# Patient Record
Sex: Female | Born: 1950 | Race: White | Hispanic: No | Smoking: Never smoker
Health system: Southern US, Community
[De-identification: ages and names within clinical notes are randomized; demographics above are authoritative.]

## PROBLEM LIST (undated history)

## (undated) DIAGNOSIS — M1990 Unspecified osteoarthritis, unspecified site: Secondary | ICD-10-CM

## (undated) DIAGNOSIS — M069 Rheumatoid arthritis, unspecified: Secondary | ICD-10-CM

## (undated) DIAGNOSIS — D649 Anemia, unspecified: Secondary | ICD-10-CM

## (undated) DIAGNOSIS — E119 Type 2 diabetes mellitus without complications: Secondary | ICD-10-CM

## (undated) DIAGNOSIS — I10 Essential (primary) hypertension: Secondary | ICD-10-CM

## (undated) HISTORY — PX: MOUTH SURGERY: SHX715

## (undated) HISTORY — DX: Anemia, unspecified: D64.9

---

## 2014-02-27 ENCOUNTER — Encounter (HOSPITAL_COMMUNITY): Payer: Self-pay | Admitting: Emergency Medicine

## 2014-02-27 ENCOUNTER — Emergency Department (HOSPITAL_COMMUNITY)
Admission: EM | Admit: 2014-02-27 | Discharge: 2014-02-27 | Disposition: A | Discharge status: Home / Self Care | Payer: BC Managed Care – PPO | Attending: Emergency Medicine | Admitting: Emergency Medicine

## 2014-02-27 DIAGNOSIS — Z791 Long term (current) use of non-steroidal anti-inflammatories (NSAID): Secondary | ICD-10-CM | POA: Insufficient documentation

## 2014-02-27 DIAGNOSIS — I83899 Varicose veins of unspecified lower extremities with other complications: Secondary | ICD-10-CM

## 2014-02-27 DIAGNOSIS — E119 Type 2 diabetes mellitus without complications: Secondary | ICD-10-CM | POA: Insufficient documentation

## 2014-02-27 DIAGNOSIS — E669 Obesity, unspecified: Secondary | ICD-10-CM | POA: Insufficient documentation

## 2014-02-27 DIAGNOSIS — I8390 Asymptomatic varicose veins of unspecified lower extremity: Secondary | ICD-10-CM | POA: Insufficient documentation

## 2014-02-27 DIAGNOSIS — I10 Essential (primary) hypertension: Secondary | ICD-10-CM | POA: Insufficient documentation

## 2014-02-27 DIAGNOSIS — Z79899 Other long term (current) drug therapy: Secondary | ICD-10-CM | POA: Insufficient documentation

## 2014-02-27 DIAGNOSIS — IMO0002 Reserved for concepts with insufficient information to code with codable children: Secondary | ICD-10-CM | POA: Insufficient documentation

## 2014-02-27 HISTORY — DX: Essential (primary) hypertension: I10

## 2014-02-27 HISTORY — DX: Type 2 diabetes mellitus without complications: E11.9

## 2014-02-27 NOTE — Discharge Instructions (Signed)
Monitor legs closely for the next few days for recurrent bleeding or further ruptured varicose veins.   If significant swelling, redness, warmth to touch, or pain in calf when walking occurs, return to the ED for repeat evaluation.  Bleeding Varicose Veins Varicose veins are veins that have become enlarged and twisted. Valves in the veins help return blood from the leg to the heart. If these valves are damaged, blood flows backwards and backs up into the veins in the leg near the skin. This causes the veins to become larger because of increased pressure within. Sometimes these veins bleed. CAUSES  Factors that can lead to bleeding varicose veins include:  Thinning of the skin that covers the veins. This skin is stretched as the veins enlarge.  Weak and thinning walls of the varicose veins. These thin walls are part of the reason why blood is not flowing normally to the heart.  Having high pressure in the veins. This high pressure occurs because the blood is not flowing freely back up to the heart.  Injury. Even a small injury to a varicose vein can cause bleeding.  Open wounds. A sore may develop near a varicose vein and not heal. This makes bleeding more likely.  Taking medicine that thins the blood. These medicines may include aspirin, anti-inflammatory medicine, and other blood thinners. SYMPTOMS  If bleeding is on the outside surface of the skin, blood can be seen. Sometimes, the bleeding stays under the skin. If this happens, the blue or purple area will spread beyond the vein. This discoloration may be visible. DIAGNOSIS  To decide if you have a bleeding varicose vein, your caregiver may:  Ask about your symptoms. This will include when you first saw bleeding.  Ask about how long you have had varicose veins and if they cause you problems.  Ask about your overall health.  Ask about possible causes, like recent cuts or if the area near the varicose veins was bumped or  injured.  Examine the skin or leg that concerns you. Your caregiver will probably feel the veins.  Order imaging tests. These create detailed pictures of the veins. TREATMENT  The first goal of treating bleeding varicose veins is to stop the bleeding. Then, the aim is to keep any bleeding from happening again. Treatment will depend on the cause of the bleeding and how bad it is. Ask your caregiver about what would be best for you. Options include:  Raising (elevating) your leg. Lie down with your leg propped up on a pillow or cushion. Your foot should be above your heart.  Applying pressure to the spot that is bleeding. The bleeding should stop in a short time.  Wearing elastic stocking that "compress" your legs (compression stockings). An elastic bandage may do the same thing.  Applying an antibiotic cream on sores that are not healing.  Surgically removing or closing off the bleeding varicose veins. HOME CARE INSTRUCTIONS   Apply any creams that your caregiver prescribed. Follow the directions carefully.  Wear compression stockings or any special wraps that were prescribed. Make sure you know:  If you should wear them every day.  How long you should wear them.  If veins were removed or closed, a bandage (dressing) will probably cover the area. Make sure you know:  How often the dressing should be changed.  Whether the area can get wet.  When you can leave the skin uncovered.  Check your skin every day. Look for new sores and signs of  bleeding.  To prevent future bleeding:  Use extra care in situations where you could cut your legs. Shaving, for example, or working outside in the garden.  Try to keep your legs elevated as much as possible. Lie down when you can. SEEK MEDICAL CARE IF:   You have any questions about how to wear compression stockings or elastic bandages.  Your veins continue to bleed.  Sores develop near your varicose veins.  You have a sore that does  not heal or gets bigger.  Pain increases in your leg.  The area around a varicose vein becomes warm, red, or tender to the touch.  You notice a yellowish fluid that smells bad coming from a spot where there was bleeding.  You develop a fever of more than 100.5 F (38.1 C). SEEK IMMEDIATE MEDICAL CARE IF:   You develop a fever of more than 102 F (38.9 C). Document Released: 02/18/2009 Document Revised: 12/25/2011 Document Reviewed: 11/28/2010 [facility] Patient Information 2014 [place].

## 2014-02-27 NOTE — ED Provider Notes (Signed)
CSN: [id number]     Arrival date & time 02/27/14  [id number] History   First MD Initiated Contact with Patient 02/27/14 [phone]     Chief Complaint  Patient presents with  . Leg Swelling     (Consider location/radiation/quality/duration/timing/severity/associated sxs/prior Treatment) The history is provided by the patient and medical records.   This is a 63 year old female with past medical history significant for hypertension and diabetes, presenting to the ED for spontaneous bleeding of her left lower extremity.  Pt states she was walking out of her daughters house and felt that her pants were yet so she looked down and saw blood spewing out of her left medial calf.  No injury, trauma, or laceration.  States leg continued bleeding for additional 10-15 despite constant pressure so called EMS.  By time of EMS arrival bleeding has stopped without recurrence.  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  She does have some varicose veins.  No hx of venous stasis or claudication.  Denies pain of either leg, no pain when walking.  Pt is not on any anti-coagulants.  No prior hx of DVT or PE.  No chest pain or SOB.  Past Medical History  Diagnosis Date  . Hypertension   . Diabetes mellitus without complication    History reviewed. No pertinent past surgical history. History reviewed. No pertinent family history. History  Substance Use Topics  . Smoking status: Never Smoker   . Smokeless tobacco: Not on file  . Alcohol Use: No   OB History   Grav Para Term Preterm Abortions TAB SAB Ect Mult Living                 Review of Systems  Hematological:       Bleeding  All other systems reviewed and are negative.     Allergies  Quinolones  Home Medications   Prior to Admission medications   Medication Sig Start Date End Date Taking? Authorizing Provider  cetirizine (ZYRTEC) 10 MG tablet  Take 10 mg by mouth daily.   Yes Historical Provider, MD  cyclobenzaprine (FLEXERIL) 10 MG tablet Take 10 mg by mouth 3 (three) times daily as needed for muscle spasms.   Yes Historical Provider, MD  diclofenac (VOLTAREN) 75 MG EC tablet Take 75 mg by mouth 2 (two) times daily.   Yes Historical Provider, MD  fluticasone (FLONASE) 50 MCG/ACT nasal spray Place into both nostrils daily.   Yes Historical Provider, MD  metFORMIN (GLUCOPHAGE) 500 MG tablet Take 250 mg by mouth 2 (two) times daily with a meal.   Yes Historical Provider, MD  Multiple Vitamins-Minerals (MULTIVITAMIN WITH MINERALS) tablet Take 1 tablet by mouth daily.   Yes Historical Provider, MD  OVER THE COUNTER MEDICATION Apply 1 drop to eye at bedtime as needed (for dry eyes).   Yes Historical Provider, MD  ramipril (ALTACE) 2.5 MG capsule Take 2.5 mg by mouth daily.   Yes Historical Provider, MD  ranitidine (ZANTAC) 150 MG tablet Take 150 mg by mouth 2 (two) times daily as needed for heartburn.   Yes Historical Provider, MD  sitaGLIPtin (JANUVIA) 100 MG tablet Take 100 mg by mouth daily.   Yes Historical Provider, MD  vitamin E 400 UNIT capsule Take 400 Units by mouth daily.   Yes Historical Provider, MD   BP 181/94  Pulse 77  Temp(Src) 98.2 F (36.8  C) (Oral)  Resp 18  Ht 5\' 8"  (1.727 m)  Wt 240 lb (108.863 kg)  BMI 36.50 kg/m2  SpO2 100%  Physical Exam  Nursing note and vitals reviewed. Constitutional: She is oriented to person, place, and time. She appears well-developed and well-nourished.  obese  HENT:  Head: Normocephalic and atraumatic.  Mouth/Throat: Oropharynx is clear and moist.  Eyes: Conjunctivae and EOM are normal. Pupils are equal, round, and reactive to light.  Neck: Normal range of motion.  Cardiovascular: Normal rate, regular rhythm and normal heart sounds.   Pulmonary/Chest: Effort normal and breath sounds normal.  Musculoskeletal: Normal range of motion.  BLE with multiple small variscosities; LLE  with fresh scab overlying one of variscosities; no active bleeding or signs of superimposed infection; no focal tenderness No bony deformities; No significant calf asymmetry, tenderness, or palpable cords; mild swelling of left ankle when compared with right; no overlying erythema or warmth to touch Strong DP pulses bilaterally  Neurological: She is alert and oriented to person, place, and time.  Skin: Skin is warm and dry.  Psychiatric: She has a normal mood and affect.    ED Course  Procedures (including critical care time) Labs Review Labs Reviewed - No data to display  Imaging Review No results found.   EKG Interpretation None      MDM   Final diagnoses:  Ruptured varicose vein   63 year old female presenting with spontaneous bleeding of left lower extremity today without known injury. On arrival to the ED bleeding has stopped. She has a small scab overlying a left lower extremity varicosity. Both her bilateral extremities have multiple small varicosities. There is no significant calf asymmetry, tenderness, or palpable cords. There is mild swelling of her left ankle compared with right.  No overlying erythema or warmth to touch.  DP pulses intact bilaterally.  Pt not on anti-coagulants.  At this time i have low suspicion for DVT.  Spontaneous bleeding likely from ruptured varicose vein.  She will FU with her PCP.  Discussed plan with patient, he/she acknowledged understanding and agreed with plan of care.  Return precautions given for new or worsening symptoms.  Garlon HatchetLisa M Monisha Siebel, PA-C 02/27/14 1012

## 2014-02-27 NOTE — ED Provider Notes (Signed)
Medical screening examination/treatment/procedure(s) were performed by non-physician practitioner and as supervising physician I was immediately available for consultation/collaboration.     Jennifer Lyonsouglas Daivion Pape, MD 02/27/14 [phone]

## 2014-02-27 NOTE — ED Notes (Signed)
Pt here from home with c/o  Bleeding from the lfet lower leg, pt does have some swelling to both legs , pt states that they held pressure for 10 to 15 mins , no bleeding at this time

## 2014-03-02 ENCOUNTER — Other Ambulatory Visit: Payer: Self-pay | Admitting: *Deleted

## 2014-03-02 DIAGNOSIS — I83893 Varicose veins of bilateral lower extremities with other complications: Secondary | ICD-10-CM

## 2014-03-24 ENCOUNTER — Encounter: Payer: Self-pay | Admitting: Vascular Surgery

## 2014-03-25 ENCOUNTER — Ambulatory Visit (HOSPITAL_COMMUNITY)
Admission: RE | Admit: 2014-03-25 | Discharge: 2014-03-25 | Disposition: A | Discharge status: Home / Self Care | Payer: BC Managed Care – PPO | Source: Ambulatory Visit | Attending: Vascular Surgery | Admitting: Vascular Surgery

## 2014-03-25 ENCOUNTER — Ambulatory Visit (INDEPENDENT_AMBULATORY_CARE_PROVIDER_SITE_OTHER): Payer: BC Managed Care – PPO | Admitting: Vascular Surgery

## 2014-03-25 ENCOUNTER — Encounter: Payer: Self-pay | Admitting: Vascular Surgery

## 2014-03-25 VITALS — BP 154/75 | HR 80 | Ht 68.0 in | Wt 247.0 lb

## 2014-03-25 DIAGNOSIS — I83893 Varicose veins of bilateral lower extremities with other complications: Secondary | ICD-10-CM

## 2014-03-25 NOTE — Progress Notes (Signed)
[facility] [place] [facility]   CC:  Bleeding from varicose vein  No ref. provider found  HPI: This is a 63 y.o. female who states that on Feb 27, 2014, she was getting ready for work that morning and states that she started having bleeding from her lower left leg.  She states that she and her daughter held pressure, but the bleeding would not stop.  EMS was called and by the time they arrived, the bleeding had stopped.  [facility] and was discharged home.  The next day, the same thing happened and she was bleeding from the same place.  EMS was called and she was taken to the ER.  There a suture was placed and pressure dressing applied.  She has not had any more bleeding.  She states that she has a vein above where she was bleeding that she is concerned about.  She states that she has had some swelling in her legs. 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  She has not tried compression stockings.    She states that she does have DM and takes Metformin.  She is also seeing a rheumatologist for possible RA.  She is on ACEI for HTN.  Past Medical History  Diagnosis Date  . Hypertension   . Diabetes mellitus without complication   . Anemia    History reviewed. No pertinent past surgical history.  Allergies  Allergen Reactions  . Quinolones Swelling    Current Outpatient Prescriptions  Medication Sig Dispense Refill  . diclofenac (VOLTAREN) 75 MG EC tablet Take 75 mg by mouth 2 (two) times daily.      . furosemide (LASIX) 20 MG tablet Take by mouth daily.       . metFORMIN (GLUCOPHAGE) 500 MG tablet Take 250 mg by mouth 2 (two) times daily with a meal.      . ramipril (ALTACE) 2.5 MG capsule Take 2.5 mg by mouth daily.      . sitaGLIPtin (JANUVIA) 100 MG tablet Take 100 mg by mouth daily.      . vitamin E 400 UNIT capsule Take 400 Units by mouth daily.      . cetirizine (ZYRTEC) 10 MG tablet Take 10 mg by mouth daily.      .  cyclobenzaprine (FLEXERIL) 10 MG tablet Take 10 mg by mouth 3 (three) times daily as needed for muscle spasms.      . fluticasone (FLONASE) 50 MCG/ACT nasal spray Place into both nostrils daily.      . Multiple Vitamins-Minerals (MULTIVITAMIN WITH MINERALS) tablet Take 1 tablet by mouth daily.      Marland Kitchen. OVER THE COUNTER MEDICATION Apply 1 drop to eye at bedtime as needed (for dry eyes).      . ranitidine (ZANTAC) 150 MG tablet Take 150 mg by mouth 2 (two) times daily as needed for heartburn.       No current facility-administered medications for this visit.    Family History  Problem Relation Age of Onset  . Diabetes Mother   . Hyperlipidemia Mother   . Hypertension Mother   . Hypertension Father     History   Social History  . Marital Status: Divorced    Spouse Name: N/A    Number of Children: N/A  . Years of Education: N/A   Occupational History  . Not on file.   Social History Main Topics  . Smoking status:  Never Smoker   . Smokeless tobacco: Never Used  . Alcohol Use: No  . Drug Use: No  . Sexual Activity: Not on file   Other Topics Concern  . Not on file   Social History Narrative  . No narrative on file     ROS: [x]  Positive   [ ]  Negative   [ ]  All sytems reviewed and are negative  Cardiovascular: []  chest pain/pressure []  palpitations []  SOB lying flat []  DOE []  pain in legs while walking []  pain in feet when lying flat []  hx of DVT []  hx of phlebitis [x]  swelling in legs [x]  varicose veins  Pulmonary: []  productive cough []  asthma []  wheezing  Neurologic: []  weakness in []  arms []  legs []  numbness in []  arms []  legs [] difficulty speaking or slurred speech []  temporary loss of vision in one eye []  dizziness  Hematologic: []  bleeding problems []  problems with blood clotting easily  Endocrine: [x]  DM  GI []  vomiting blood []  blood in stool  GU: []  burning with urination []  blood in urine  Psychiatric: []  hx of major  depression  Integumentary: [x]  rashes-eczema []  ulcers  Constitutional: []  fever []  chills   PHYSICAL EXAMINATION:  Filed Vitals:   03/25/14 1520  BP: 154/75  Pulse: 80   Body mass index is 37.56 kg/(m^2).  General:  WDWN in NAD Gait: Not observed HENT: WNL, normocephalic Eyes: Pupils equal Pulmonary: normal non-labored breathing , without Rales, rhonchi,  wheezing Cardiac: RRR, without  Murmurs, rubs or gallops; without carotid bruits Abdomen: soft, NT, no masses Skin: without rashes, without ulcers  Vascular Exam/Pulses:  Right Left  Radial 2+ (normal) 2+ (normal)  Ulnar 1+ (weak) 1+ (weak)  DP 2+ (normal) 2+ (normal)  PT Unable to palpate Unable to palpate   Extremities: without ischemic changes, without Gangrene , without cellulitis; without open wounds; + edema BLE; healed area of previous bleed; + varicosity LLE; teleangectasias BLE Musculoskeletal: no muscle wasting or atrophy  Neurologic: A&O X 3; Appropriate Affect ; SENSATION: normal; MOTOR FUNCTION:  moving all extremities equally. Speech is fluent/normal   Non-Invasive Vascular Imaging:   Lower extremity venous reflux evaluation 03/25/14: 1.  Positive for deep vein reflux in CFV & SFV 2.  No reflux demonstrated in GSV 3.  No overt perforation of VV near area of rupture.  Pt meds includes: Statin:  no Beta Blocker:  no Aspirin:  no ACEI:  yes ARB:  no Other Antiplatelet/Anticoagulant:  no  ASSESSMENT/PLAN:: 63 y.o. female with recent bleeding from varicose vein in left lower leg.   -pt does have some deep vein reflux, but no superficial venous reflux. -pt is given instructions on what to do if she does have a bleed again in the future. -she is instructed on proper leg elevation to help improve the swelling in her legs. -she will f/u with [place] on a PRN basis  Doreatha Massed, PA-C [facility] [phone]  Clinic MD:  Pt seen and examined in conjunction with Dr. Edilia Bo  Agree  with above. We have discussed the importance of intermittent leg elevation and the use of compression stockings. We will see her back as needed to  Waverly Ferrari, MD, FACS Beeper [phone] 03/25/2014

## 2016-08-07 ENCOUNTER — Other Ambulatory Visit: Payer: Self-pay | Admitting: Radiology

## 2016-08-07 ENCOUNTER — Telehealth: Payer: Self-pay | Admitting: Rheumatology

## 2016-08-07 MED ORDER — METHOTREXATE 2.5 MG PO TABS: ORAL_TABLET | ORAL | 0 refills | Status: DC

## 2016-08-07 NOTE — Telephone Encounter (Signed)
Last visit 04/04/16 Next visit 08/22/16 Labs WNL 07/19/16 Ok to refill MTX per SD

## 2016-08-07 NOTE — Telephone Encounter (Signed)
Pt is requesting refill of MTX.  [facility] [place]

## 2016-08-09 NOTE — Telephone Encounter (Signed)
This was done on 08/07/16

## 2016-08-19 DIAGNOSIS — M0579 Rheumatoid arthritis with rheumatoid factor of multiple sites without organ or systems involvement: Secondary | ICD-10-CM | POA: Insufficient documentation

## 2016-08-19 DIAGNOSIS — Z79899 Other long term (current) drug therapy: Secondary | ICD-10-CM | POA: Insufficient documentation

## 2016-08-21 DIAGNOSIS — E119 Type 2 diabetes mellitus without complications: Secondary | ICD-10-CM | POA: Insufficient documentation

## 2016-08-21 DIAGNOSIS — I10 Essential (primary) hypertension: Secondary | ICD-10-CM | POA: Insufficient documentation

## 2016-08-21 NOTE — Progress Notes (Signed)
*IMAGE* Office Visit Note  Patient: Jennifer Horn             Date of Birth: 09/25/1951           MRN: [id number]             PCP: Berton Bon M.D. Referring: No ref. provider found Visit Date: 08/22/2016 Occupation: High school teacher    Subjective:  Pain knees   History of Present Illness: Lael Wetherbee is a 65 y.o. female with history of sero positive rheumatoid arthritis. She states that she's been doing fairly well on methotrexate 4 tablets per week. She has some stiffness in her hands and knee joints. The pain in the knee joints comes and goes. The neck pain is controlled currently. She denies any joint swelling.   Activities of Daily Living:  Patient reports morning stiffness for 10 minutes.   Patient Denies nocturnal pain.  Difficulty dressing/grooming: Denies Difficulty climbing stairs: Reports Difficulty getting out of chair: Reports Difficulty using hands for taps, buttons, cutlery, and/or writing: Denies   Review of Systems  Constitutional: Negative for fatigue, night sweats, weight gain, weight loss and weakness.  HENT: Negative for mouth sores, trouble swallowing, trouble swallowing, mouth dryness and nose dryness.   Eyes: Negative for pain, redness, visual disturbance and dryness.  Respiratory: Negative for cough, shortness of breath and difficulty breathing.   Cardiovascular: Positive for hypertension. Negative for chest pain, palpitations, irregular heartbeat and swelling in legs/feet.  Gastrointestinal: Negative for blood in stool, constipation and diarrhea.  Endocrine: Negative for increased urination.  Genitourinary: Negative for vaginal dryness.  Musculoskeletal: Positive for arthralgias, joint pain and morning stiffness. Negative for joint swelling, myalgias, muscle weakness, muscle tenderness and myalgias.  Skin: Negative for color change, rash, hair loss, skin tightness, ulcers and sensitivity to sunlight.  Allergic/Immunologic: Negative for  susceptible to infections.  Neurological: Negative for dizziness, memory loss and night sweats.  Hematological: Negative for swollen glands.  Psychiatric/Behavioral: Negative for depressed mood and sleep disturbance. The patient is not nervous/anxious.     PMFS History:  Patient Active Problem List   Diagnosis Date Noted  . Diabetes (HCC) 08/21/2016  . Hypertension 08/21/2016  . Rheumatoid arthritis with rheumatoid factor of multiple sites without organ or systems involvement (HCC) 08/19/2016  . High risk medication use 08/19/2016  . Varicose veins of lower extremities with other complications 03/25/2014    Past Medical History:  Diagnosis Date  . Anemia   . Diabetes mellitus without complication (HCC)   . Hypertension     Family History  Problem Relation Age of Onset  . Diabetes Mother   . Hyperlipidemia Mother   . Hypertension Mother   . Hypertension Father    Past Surgical History:  Procedure Laterality Date  . MOUTH SURGERY     Social History   Social History Narrative  . No narrative on file     Objective: Vital Signs: BP (!) 182/92 (BP Location: Right Arm, Patient Position: Sitting, Cuff Size: Large)   Pulse 81   Resp 14   Ht 5\' 7"  (1.702 m)   Wt 257 lb (116.6 kg)   BMI 40.25 kg/m    Physical Exam  Constitutional: She is oriented to person, place, and time. She appears well-developed and well-nourished.  HENT:  Head: Normocephalic and atraumatic.  Eyes: Conjunctivae and EOM are normal.  Neck: Normal range of motion.  Cardiovascular: Normal rate, regular rhythm, normal heart sounds and intact distal pulses.   Pulmonary/Chest: Effort normal and  breath sounds normal.  Abdominal: Soft. Bowel sounds are normal.  Lymphadenopathy:    She has no cervical adenopathy.  Neurological: She is alert and oriented to person, place, and time.  Skin: Skin is warm and dry. Capillary refill takes less than 2 seconds.  Psychiatric: She has a normal mood and affect. Her  behavior is normal.  Nursing note and vitals reviewed.    Musculoskeletal Exam: She has good range of motion of her C-spine, she has mild kyphosis and thoracic spine, some limitation of range of motion of her lumbar spine. She has good range of motion of her shoulders elbows wrists joints MCP joints and PIP joints with mild thickening of her DIP joints. No synovitis was noted. Hip joints, knee joints, ankle joints were good range of motion she has thickening of PIP joints. No synovitis was noted.  CDAI Exam: No CDAI exam completed.    Investigation: Findings:  May 2015 TB gold negative, hepatitis negative, SPEP normal, immunoglobulins normal, G6PD normal, 07/18/2016 CBC showed hemoglobin 11.3,, CMP creatinine 1.22, GFR 47, 01/14/2016 creatinine 1.07 GFR 55    Imaging: Xr Foot 2 Views Left  Result Date: 08/22/2016 First MTP narrowing and hallux valgus. All PIP/DIP narrowing. No erosive changes were noted. Impression: These findings are consistent with osteoarthritis no erosive changes were noted.  Xr Foot 2 Views Right  Result Date: 08/22/2016 First MTP narrowing and hallux valgus. All PIP/DIP narrowing. No erosive changes were noted. Impression: These findings are consistent with osteoarthritis no erosive changes were noted.  Xr Hand 2 View Left  Result Date: 08/22/2016 Narrowing noted in all PIP and DIP joints, no MCP joint narrowing or erosive changes were noted. Mild radiocarpal joint space narrowing was noted. No erosive changes were noted in the carpal bones. These findings were consistent with inflammatory arthritis and osteoarthritis overlap  Xr Hand 2 View Right  Result Date: 08/22/2016 Narrowing noted in all PIP and DIP joints, no MCP joint narrowing or erosive changes were noted. Mild radiocarpal joint space narrowing was noted. No erosive changes were noted in the carpal bones. These findings were consistent with inflammatory arthritis and osteoarthritis  overlap.   Speciality Comments: No specialty comments available.    Procedures:  No procedures performed Allergies: Levofloxacin and Quinolones   Assessment / Plan: Visit Diagnoses:   Rheumatoid arthritis with rheumatoid factor of multiple sites without organ or systems involvement (HCC) - +RF,-CCP,highESR, +synovitis on US. She is clinically doing well without any synovitis on examination she has mild tenderness or PIP joints she has thickening of her DIP joints which is consistent with osteoarthritis. She has some stiffness in her hands and feet and I'll obtain x-rays of her bilateral hands and feet today.  High risk medication use: She is on methotrexate 4 tablets by mouth every week along with folic acid. Her labs have been stable. Her GFR is low at 55. I will check her labs again in January and every 3 months to monitor for drug toxicity standing orders were given today.  Primary osteoarthritis of knee: Her knee joint pain is better. Weight loss diet and exercise was discussed.    DJD (degenerative joint disease), cervical: She has minimal stiffness.   Type 2 diabetes mellitus: Followed up by her PCP  Hypertension,: Her blood pressure is high today. She states her blood pressure is usually high in physician's office. 5 advised her to monitor her pressure closely and follow up with her PCP.   Orders: Orders Placed This Encounter  Procedures  . XR Hand 2 View Right  . XR Hand 2 View Left  . XR Foot 2 Views Right  . XR Foot 2 Views Left  . CBC with Differential/Platelet  . COMPLETE METABOLIC PANEL WITH GFR     Face-to-face time spent with patient was 30 minutes. 50% of time was spent in counseling and coordination of care.  Follow-Up Instructions: Return in about 5 months (around 01/20/2017) for Rheumatoid arthritis.      Pollyann SavoyShaili Donyell Ding, MD, Aileen PilotFACR

## 2016-08-22 ENCOUNTER — Telehealth: Payer: Self-pay | Admitting: *Deleted

## 2016-08-22 ENCOUNTER — Encounter: Payer: Self-pay | Admitting: Rheumatology

## 2016-08-22 ENCOUNTER — Ambulatory Visit (INDEPENDENT_AMBULATORY_CARE_PROVIDER_SITE_OTHER): Payer: BC Managed Care – PPO | Admitting: Rheumatology

## 2016-08-22 ENCOUNTER — Ambulatory Visit (INDEPENDENT_AMBULATORY_CARE_PROVIDER_SITE_OTHER): Payer: Self-pay

## 2016-08-22 VITALS — BP 182/92 | HR 81 | Resp 14 | Ht 67.0 in | Wt 257.0 lb

## 2016-08-22 DIAGNOSIS — M79642 Pain in left hand: Secondary | ICD-10-CM | POA: Diagnosis not present

## 2016-08-22 DIAGNOSIS — M79672 Pain in left foot: Secondary | ICD-10-CM

## 2016-08-22 DIAGNOSIS — I10 Essential (primary) hypertension: Secondary | ICD-10-CM | POA: Diagnosis not present

## 2016-08-22 DIAGNOSIS — M5030 Other cervical disc degeneration, unspecified cervical region: Secondary | ICD-10-CM | POA: Diagnosis not present

## 2016-08-22 DIAGNOSIS — M1710 Unilateral primary osteoarthritis, unspecified knee: Secondary | ICD-10-CM

## 2016-08-22 DIAGNOSIS — M0579 Rheumatoid arthritis with rheumatoid factor of multiple sites without organ or systems involvement: Secondary | ICD-10-CM

## 2016-08-22 DIAGNOSIS — M79641 Pain in right hand: Secondary | ICD-10-CM | POA: Diagnosis not present

## 2016-08-22 DIAGNOSIS — M47812 Spondylosis without myelopathy or radiculopathy, cervical region: Secondary | ICD-10-CM

## 2016-08-22 DIAGNOSIS — E119 Type 2 diabetes mellitus without complications: Secondary | ICD-10-CM | POA: Diagnosis not present

## 2016-08-22 DIAGNOSIS — M79671 Pain in right foot: Secondary | ICD-10-CM | POA: Diagnosis not present

## 2016-08-22 DIAGNOSIS — Z79899 Other long term (current) drug therapy: Secondary | ICD-10-CM

## 2016-08-22 NOTE — Patient Instructions (Signed)
Standing Labs We placed an order today for your standing lab work.    Please come back and get your standing labs in January and every 3 months  We have open lab Monday through Friday from 8:30-11:30 AM and 1-4 PM at the office of Dr. Charron Coultas/Naitik Panwala, PA.   The office is located at [address], [address], [place], [place] [address] No appointment is necessary.   Labs are drawn by [facility].  You may receive a bill from [facility] for your lab work.    

## 2016-08-22 NOTE — Telephone Encounter (Signed)
Patient had x-rays while in office. Contacted patient and advised her that per Dr. Corliss Skainseveshwar that her x-rays showed osteoarthritis and not much rheumatoid arthritis damage. Patient requested a copy of the impression of x-rays to be sent to her PCP. Impression was faxed. Patient verbalized understanding.

## 2016-09-26 ENCOUNTER — Encounter (INDEPENDENT_AMBULATORY_CARE_PROVIDER_SITE_OTHER): Payer: Self-pay | Admitting: Orthopaedic Surgery

## 2016-09-26 ENCOUNTER — Ambulatory Visit (INDEPENDENT_AMBULATORY_CARE_PROVIDER_SITE_OTHER): Payer: Self-pay

## 2016-09-26 ENCOUNTER — Ambulatory Visit (INDEPENDENT_AMBULATORY_CARE_PROVIDER_SITE_OTHER): Payer: BC Managed Care – PPO | Admitting: Orthopaedic Surgery

## 2016-09-26 VITALS — BP 173/88 | HR 94 | Ht 67.0 in | Wt 240.0 lb

## 2016-09-26 DIAGNOSIS — M25561 Pain in right knee: Secondary | ICD-10-CM | POA: Insufficient documentation

## 2016-09-26 NOTE — Progress Notes (Signed)
Office Visit Note   Patient: Jennifer Horn           Date of Birth: Nov 25, 1950           MRN: [id number] Visit Date: 09/26/2016              Requested by: Gordan PaymentGreg A. Grisso, MD [address] [place], [place] [id number] PCP: Feliciana RossettiGRISSO,GREG, MD   Assessment & Plan: Visit Diagnoses:  1. Acute pain of right knee    X-rays show some moderate osteoarthritis. 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 Plan: Intra-articular injection performed left knee. She got good relief with the injection. Last injection was 5 years ago. Hopefully her symptoms are related to CPPD with some acute synovitis. She can continue to work on weight loss and office follow-up if she has continued symptoms in the injection is not effective.  Follow-Up Instructions: No Follow-up on file.   Orders:  Orders Placed This Encounter  Procedures  . XR KNEE 3 VIEW RIGHT   No orders of the defined types were placed in this encounter.     Procedures: No procedures performed   Clinical Data: No additional findings.   Subjective: Chief Complaint  Patient presents with  . Right Knee - Pain, Edema    Ms. Jennifer Horn is here for right knee pain. She states that there is no injury it. She says pain is on the medial side of the knee, sometimes it aches and sometimes its really painful with getting up.  She has had some swelling and it took half of a fluid pill that seemed to help some. She did take her rheumatoid medication and that seemed to help for a few days.     Review of Systems with impervious systems updated positive for rheumatoid arthritis and also the CPPD with some calcification  of the meniscus   Objective: Vital Signs: BP (!) 173/88 (BP Location: Left Arm, Patient Position: Sitting)   Pulse 94   Ht 5\' 7"  (1.702 m)   Wt 240 lb (108.9 kg)   BMI 37.59 kg/m   Physical Exam Patient is alert and oriented. Her movements intact trachea is midline thyroid is not palpably  enlarged no audible wheezing no abdominal tenderness liver and spleen are nonpalpable chest: Obesity. Hip range of motion is normal. There is crepitus of both the range of motion right knee shows tenderness she may have an effusion difficult to determine with body habitus. No venous stasis changes. Distal pulses palpable dorsalis pedis pulse. Some DIP degenerative changes in the digits. Ortho Exam patient has joint line tenderness crepitus with knee extension pain with patellar loading.  Specialty Comments:  No specialty comments available.  Imaging: Xr Knee 3 View Right  Result Date: 09/26/2016 Three-view x-rays right knee obtained. This shows some evidence of chondrocalcinosis with calcification of the meniscus. Flatten the medial femoral condyle some marginal osteophytes laterally. She has diagnosis of rheumatoid arthritis but these changes are more typical of osteoarthritis. Impression moderate arthritis right knee more characteristic of osteoarthritis than rheumatoid arthritis    PMFS History: Patient Active Problem List   Diagnosis Date Noted  . Acute pain of right knee 09/26/2016  . Diabetes (HCC) 08/21/2016  . Hypertension 08/21/2016  . Rheumatoid arthritis with rheumatoid factor of multiple sites without organ or systems involvement (HCC) 08/19/2016  . High risk medication use 08/19/2016  . Varicose veins of lower extremities with other complications 03/25/2014   Past Medical History:  Diagnosis Date  .  Anemia   . Diabetes mellitus without complication (HCC)   . Hypertension     Family History  Problem Relation Age of Onset  . Diabetes Mother   . Hyperlipidemia Mother   . Hypertension Mother   . Hypertension Father     Past Surgical History:  Procedure Laterality Date  . MOUTH SURGERY     Social History   Occupational History  . Not on file.   Social History Main Topics  . Smoking status: Never Smoker  . Smokeless tobacco: Never Used  . Alcohol use Yes      Comment: Occassionally  . Drug use: No  . Sexual activity: Not on file

## 2016-10-07 ENCOUNTER — Other Ambulatory Visit: Payer: Self-pay | Admitting: Rheumatology

## 2016-10-11 ENCOUNTER — Telehealth: Payer: Self-pay | Admitting: Rheumatology

## 2016-10-11 NOTE — Telephone Encounter (Signed)
Ok to refill per Dr Corliss Skainseveshwar was sent this am

## 2016-10-11 NOTE — Telephone Encounter (Signed)
Patient is [facility] [place]

## 2016-10-17 ENCOUNTER — Other Ambulatory Visit: Payer: Self-pay | Admitting: Radiology

## 2016-10-17 DIAGNOSIS — Z79899 Other long term (current) drug therapy: Secondary | ICD-10-CM

## 2016-10-17 LAB — CBC WITH DIFFERENTIAL/PLATELET
BASOS ABS: 0 {cells}/uL (ref 0–200)
Basophils Relative: 0 %
Eosinophils Absolute: 95 cells/uL (ref 15–500)
Eosinophils Relative: 1 %
HCT: 37.2 % (ref 35.0–45.0)
Hemoglobin: 11.9 g/dL (ref 11.7–15.5)
LYMPHS PCT: 11 %
Lymphs Abs: 1045 cells/uL (ref 850–3900)
MCH: 30.9 pg (ref 27.0–33.0)
MCHC: 32 g/dL (ref 32.0–36.0)
MCV: 96.6 fL (ref 80.0–100.0)
MONOS PCT: 3 %
MPV: 9.7 fL (ref 7.5–12.5)
Monocytes Absolute: 285 cells/uL (ref 200–950)
Neutro Abs: 8075 cells/uL — ABNORMAL HIGH (ref 1500–7800)
Neutrophils Relative %: 85 %
PLATELETS: 237 10*3/uL (ref 140–400)
RBC: 3.85 MIL/uL (ref 3.80–5.10)
RDW: 14.7 % (ref 11.0–15.0)
WBC: 9.5 10*3/uL (ref 3.8–10.8)

## 2016-10-18 LAB — COMPLETE METABOLIC PANEL WITH GFR
ALT: 6 U/L (ref 6–29)
AST: 16 U/L (ref 10–35)
Albumin: 3.8 g/dL (ref 3.6–5.1)
Alkaline Phosphatase: 106 U/L (ref 33–130)
BUN: 22 mg/dL (ref 7–25)
CHLORIDE: 103 mmol/L (ref 98–110)
CO2: 27 mmol/L (ref 20–31)
Calcium: 9.4 mg/dL (ref 8.6–10.4)
Creat: 1.05 mg/dL — ABNORMAL HIGH (ref 0.50–0.99)
GFR, EST AFRICAN AMERICAN: 65 mL/min (ref >=60)
GFR, EST NON AFRICAN AMERICAN: 56 mL/min — AB (ref >=60)
Glucose, Bld: 202 mg/dL — ABNORMAL HIGH (ref 65–99)
POTASSIUM: 5.2 mmol/L (ref 3.5–5.3)
Sodium: 138 mmol/L (ref 135–146)
Total Bilirubin: 0.3 mg/dL (ref 0.2–1.2)
Total Protein: 7 g/dL (ref 6.1–8.1)

## 2016-10-18 NOTE — Progress Notes (Signed)
Labs are stable. No change in treatment

## 2016-10-31 ENCOUNTER — Other Ambulatory Visit: Payer: Self-pay | Admitting: Rheumatology

## 2016-10-31 NOTE — Telephone Encounter (Signed)
Last Visit: 08/22/16 Next Visit: 01/18/17 Labs: 10/17/16 C/W previous  Okay to refill MTX?

## 2016-10-31 NOTE — Telephone Encounter (Signed)
ok 

## 2017-01-04 ENCOUNTER — Other Ambulatory Visit: Payer: Self-pay | Admitting: *Deleted

## 2017-01-04 MED ORDER — FOLIC ACID 1 MG PO TABS: 1.0000 mg | ORAL_TABLET | Freq: Every day | ORAL | 0 refills | Status: DC

## 2017-01-04 NOTE — Telephone Encounter (Signed)
Refill request received via fax  Last Visit: 08/22/16 Next Visit: 01/18/17  Okay to refill Folic Acid?

## 2017-01-04 NOTE — Telephone Encounter (Signed)
ok 

## 2017-01-16 ENCOUNTER — Other Ambulatory Visit: Payer: Self-pay | Admitting: *Deleted

## 2017-01-16 DIAGNOSIS — Z79899 Other long term (current) drug therapy: Secondary | ICD-10-CM

## 2017-01-16 DIAGNOSIS — M170 Bilateral primary osteoarthritis of knee: Secondary | ICD-10-CM | POA: Insufficient documentation

## 2017-01-16 DIAGNOSIS — M47812 Spondylosis without myelopathy or radiculopathy, cervical region: Secondary | ICD-10-CM | POA: Insufficient documentation

## 2017-01-16 LAB — CBC WITH DIFFERENTIAL/PLATELET
BASOS PCT: 0 %
Basophils Absolute: 0 cells/uL (ref 0–200)
Eosinophils Absolute: 186 cells/uL (ref 15–500)
Eosinophils Relative: 2 %
HCT: 37.1 % (ref 35.0–45.0)
HEMOGLOBIN: 11.9 g/dL (ref 11.7–15.5)
LYMPHS ABS: 1023 {cells}/uL (ref 850–3900)
Lymphocytes Relative: 11 %
MCH: 30.7 pg (ref 27.0–33.0)
MCHC: 32.1 g/dL (ref 32.0–36.0)
MCV: 95.9 fL (ref 80.0–100.0)
MONO ABS: 558 {cells}/uL (ref 200–950)
MPV: 9.8 fL (ref 7.5–12.5)
Monocytes Relative: 6 %
NEUTROS PCT: 81 %
Neutro Abs: 7533 cells/uL (ref 1500–7800)
Platelets: 241 10*3/uL (ref 140–400)
RBC: 3.87 MIL/uL (ref 3.80–5.10)
RDW: 14.8 % (ref 11.0–15.0)
WBC: 9.3 10*3/uL (ref 3.8–10.8)

## 2017-01-16 LAB — COMPLETE METABOLIC PANEL WITH GFR
ALBUMIN: 3.8 g/dL (ref 3.6–5.1)
ALK PHOS: 92 U/L (ref 33–130)
ALT: 8 U/L (ref 6–29)
AST: 15 U/L (ref 10–35)
BILIRUBIN TOTAL: 0.3 mg/dL (ref 0.2–1.2)
BUN: 20 mg/dL (ref 7–25)
CALCIUM: 9.4 mg/dL (ref 8.6–10.4)
CO2: 25 mmol/L (ref 20–31)
Chloride: 102 mmol/L (ref 98–110)
Creat: 1.14 mg/dL — ABNORMAL HIGH (ref 0.50–0.99)
GFR, EST AFRICAN AMERICAN: 58 mL/min — AB (ref >=60)
GFR, Est Non African American: 51 mL/min — ABNORMAL LOW (ref >=60)
Glucose, Bld: 189 mg/dL — ABNORMAL HIGH (ref 65–99)
Potassium: 5.1 mmol/L (ref 3.5–5.3)
Sodium: 138 mmol/L (ref 135–146)
Total Protein: 7 g/dL (ref 6.1–8.1)

## 2017-01-16 NOTE — Progress Notes (Signed)
Office Visit Note  Patient: Jennifer Horn             Date of Birth: October 22, 1950           MRN: [id number]             PCP: Gilford Rile, MD Referring: Raina Mina., MD Visit Date: 01/18/2017 Occupation: '@GUAROCC'$ @    Subjective:  Follow-up   History of Present Illness: Jennifer Horn is a 66 y.o. female   Last seen 08/22/2016. Patient is doing well with her rheumatoid arthritis. She's taking methotrexate 4 pills every week. She's taking folic acid 1 mg every day.  Her only complaint is that she feels slight swelling in her right fourth finger at the PIP joint. It does not prevent her from flexing/making a fist with the right hand. She does not have any pain or any warmth or redness.  Patient turned 66 on 1951-06-27 and has not had a bone density done yet. We discussed the importance of having a baseline bone density and patient is agreeable. She recently went to [facility] imaging bone density office for her daughter and request to have an appointment there. Unfortunately, due to conflict of interest, although we will not be able to refer the patient there, her PCP can do so.  Patient is almost out of methotrexate and needs a refill.  Activities of Daily Living:  Patient reports morning stiffness for 5 minutes.   Patient Denies nocturnal pain.  Difficulty dressing/grooming: Denies Difficulty climbing stairs: Denies Difficulty getting out of chair: Denies Difficulty using hands for taps, buttons, cutlery, and/or writing: Denies   No Rheumatology ROS completed.   PMFS History:  Patient Active Problem List   Diagnosis Date Noted  . DJD (degenerative joint disease), cervical 01/16/2017  . Primary osteoarthritis of both knees 01/16/2017  . Acute pain of right knee 09/26/2016  . Diabetes ([place]) 08/21/2016  . Hypertension 08/21/2016  . Rheumatoid arthritis with rheumatoid factor of multiple sites without organ or systems involvement ([place]) 08/19/2016  . High risk  medication use 08/19/2016  . Varicose veins of lower extremities with other complications 93/23/5573    Past Medical History:  Diagnosis Date  . Anemia   . Diabetes mellitus without complication ([place])   . Hypertension     Family History  Problem Relation Age of Onset  . Diabetes Mother   . Hyperlipidemia Mother   . Hypertension Mother   . Hypertension Father    Past Surgical History:  Procedure Laterality Date  . MOUTH SURGERY     Social History   Social History Narrative  . No narrative on file     Objective: Vital Signs: BP (!) 142/80   Pulse 78   Resp 16   Ht 5' 7.5" (1.715 m)   Wt 255 lb (115.7 kg)   BMI 39.35 kg/m    Physical Exam   Musculoskeletal Exam:  Full range of motion of all joints Grip strength is equal and strong bilaterally Fiber myalgia tender points are all absent  CDAI Exam: CDAI Homunculus Exam:   Tenderness:  Right hand: 4th PIP  Joint Counts:  CDAI Tender Joint count: 1 CDAI Swollen Joint count: 0  Global Assessments:  Patient Global Assessment: 2 Provider Global Assessment: 2  CDAI Calculated Score: 5  Right 4th pip joint w/ mild swelling with no pain  Investigation: Findings:  May 2015 TB gold negative, hepatitis negative, SPEP normal, immunoglobulins normal, G6PD normal, 07/18/2016 CBC showed hemoglobin 11.3,, CMP creatinine 1.22, GFR  47, 01/14/2016 creatinine 1.07 GFR 55  Her labs have been stable. Her GFR is low at 55  Orders Only on 01/16/2017  Component Date Value Ref Range Status  . WBC 01/16/2017 9.3  3.8 - 10.8 K/uL Final  . RBC 01/16/2017 3.87  3.80 - 5.10 MIL/uL Final  . Hemoglobin 01/16/2017 11.9  11.7 - 15.5 g/dL Final  . HCT 01/16/2017 37.1  35.0 - 45.0 % Final  . MCV 01/16/2017 95.9  80.0 - 100.0 fL Final  . MCH 01/16/2017 30.7  27.0 - 33.0 pg Final  . MCHC 01/16/2017 32.1  32.0 - 36.0 g/dL Final  . RDW 01/16/2017 14.8  11.0 - 15.0 % Final  . Platelets 01/16/2017 241  140 - 400 K/uL Final  . MPV  01/16/2017 9.8  7.5 - 12.5 fL Final  . Neutro Abs 01/16/2017 7533  1,500 - 7,800 cells/uL Final  . Lymphs Abs 01/16/2017 1023  850 - 3,900 cells/uL Final  . Monocytes Absolute 01/16/2017 558  200 - 950 cells/uL Final  . Eosinophils Absolute 01/16/2017 186  15 - 500 cells/uL Final  . Basophils Absolute 01/16/2017 0  0 - 200 cells/uL Final  . Neutrophils Relative % 01/16/2017 81  % Final  . Lymphocytes Relative 01/16/2017 11  % Final  . Monocytes Relative 01/16/2017 6  % Final  . Eosinophils Relative 01/16/2017 2  % Final  . Basophils Relative 01/16/2017 0  % Final  . Smear Review 01/16/2017 Criteria for review not met   Final  . Sodium 01/16/2017 138  135 - 146 mmol/L Final  . Potassium 01/16/2017 5.1  3.5 - 5.3 mmol/L Final  . Chloride 01/16/2017 102  98 - 110 mmol/L Final  . CO2 01/16/2017 25  20 - 31 mmol/L Final  . Glucose, Bld 01/16/2017 189* 65 - 99 mg/dL Final  . BUN 01/16/2017 20  7 - 25 mg/dL Final  . Creat 01/16/2017 1.14* 0.50 - 0.99 mg/dL Final   Comment:   For patients > or = 65 years of age: The upper reference limit for Creatinine is approximately 13% higher for people identified as African-American.     . Total Bilirubin 01/16/2017 0.3  0.2 - 1.2 mg/dL Final  . Alkaline Phosphatase 01/16/2017 92  33 - 130 U/L Final  . AST 01/16/2017 15  10 - 35 U/L Final  . ALT 01/16/2017 8  6 - 29 U/L Final  . Total Protein 01/16/2017 7.0  6.1 - 8.1 g/dL Final  . Albumin 01/16/2017 3.8  3.6 - 5.1 g/dL Final  . Calcium 01/16/2017 9.4  8.6 - 10.4 mg/dL Final  . GFR, Est African American 01/16/2017 58* >=60 mL/min Final  . GFR, Est Non African American 01/16/2017 51* >=60 mL/min Final  Orders Only on 10/17/2016  Component Date Value Ref Range Status  . WBC 10/17/2016 9.5  3.8 - 10.8 K/uL Final  . RBC 10/17/2016 3.85  3.80 - 5.10 MIL/uL Final  . Hemoglobin 10/17/2016 11.9  11.7 - 15.5 g/dL Final  . HCT 10/17/2016 37.2  35.0 - 45.0 % Final  . MCV 10/17/2016 96.6  80.0 - 100.0 fL  Final  . MCH 10/17/2016 30.9  27.0 - 33.0 pg Final  . MCHC 10/17/2016 32.0  32.0 - 36.0 g/dL Final  . RDW 10/17/2016 14.7  11.0 - 15.0 % Final  . Platelets 10/17/2016 237  140 - 400 K/uL Final  . MPV 10/17/2016 9.7  7.5 - 12.5 fL Final  . Neutro Abs 10/17/2016 8075*  1,500 - 7,800 cells/uL Final  . Lymphs Abs 10/17/2016 1045  850 - 3,900 cells/uL Final  . Monocytes Absolute 10/17/2016 285  200 - 950 cells/uL Final  . Eosinophils Absolute 10/17/2016 95  15 - 500 cells/uL Final  . Basophils Absolute 10/17/2016 0  0 - 200 cells/uL Final  . Neutrophils Relative % 10/17/2016 85  % Final  . Lymphocytes Relative 10/17/2016 11  % Final  . Monocytes Relative 10/17/2016 3  % Final  . Eosinophils Relative 10/17/2016 1  % Final  . Basophils Relative 10/17/2016 0  % Final  . Smear Review 10/17/2016 Criteria for review not met   Final  . Sodium 10/17/2016 138  135 - 146 mmol/L Final  . Potassium 10/17/2016 5.2  3.5 - 5.3 mmol/L Final  . Chloride 10/17/2016 103  98 - 110 mmol/L Final  . CO2 10/17/2016 27  20 - 31 mmol/L Final  . Glucose, Bld 10/17/2016 202* 65 - 99 mg/dL Final  . BUN 10/17/2016 22  7 - 25 mg/dL Final  . Creat 10/17/2016 1.05* 0.50 - 0.99 mg/dL Final   Comment:   For patients > or = 65 years of age: The upper reference limit for Creatinine is approximately 13% higher for people identified as African-American.     . Total Bilirubin 10/17/2016 0.3  0.2 - 1.2 mg/dL Final  . Alkaline Phosphatase 10/17/2016 106  33 - 130 U/L Final  . AST 10/17/2016 16  10 - 35 U/L Final  . ALT 10/17/2016 6  6 - 29 U/L Final  . Total Protein 10/17/2016 7.0  6.1 - 8.1 g/dL Final  . Albumin 10/17/2016 3.8  3.6 - 5.1 g/dL Final  . Calcium 10/17/2016 9.4  8.6 - 10.4 mg/dL Final  . GFR, Est African American 10/17/2016 65  >=60 mL/min Final  . GFR, Est Non African American 10/17/2016 56* >=60 mL/min Final      Imaging: No results found.  X-rays done by Dr. Estanislado Pandy on the last visit of 08/22/2016  of bilateral feet and bilateral hands. Please see those imaging studies for full details  Speciality Comments: No specialty comments available.    Procedures:  No procedures performed Allergies: Levofloxacin and Quinolones   Assessment / Plan:     Visit Diagnoses: DJD (degenerative joint disease), cervical  Rheumatoid arthritis with rheumatoid factor of multiple sites without organ or systems involvement ([place]) - +RF,-CCP,highESR, +synovitis on [place]  High risk medication use - MTX-2.'5mg'$  tablet 4 tablets by mouth every week ; folic acid '1mg'$  qd  Primary osteoarthritis of both knees  Type 2 diabetes mellitus without complication, unspecified long term insulin use status (H[facility])  Hypertension, unspecified type    Plan   #1: Rheumatoid arthritis. No joint pain swelling and stiffness except for the right fourth PIP joint with slight tightness when patient makes a fist. Does not prevent the patient from making a fist and it is not very painful. It is possibly synovial thickening which is creating this issue. No need to change the dosage of methotrexate at this time because it did not think it's active synovitis  #2: High risk prescription. Patient has had labs done just 01/16/2017 and labs were normal that include CBC with differential and CMP with GFR Taking methotrexate 4 pills every Friday Folic acid 1 mg every day  Patient does need a refill on methotrexate which have given her today for a 90 day supply  #3: Next blood work she'll need will be in 3 months from  now. She can come to get it out our office but she prefers to get it locally which is more convenient for her. We have ordered CBC with differential and CMP with GFR  #4: Osteoarthritis of the knees. Just early January 2018, patient saw Dr. Lorin Mercy who gave her cortisone injection in the right knee. It is given her significant relief. It was uncomfortable and the last time she had this injection was about 5 years ago. The  seeing cortisone injection allowed her to be comfortable for about 5 years.  We discussed Visco supplementation for future. At this time patient does not require Visco at all. I wanted to make her aware of the options available should she have another cortisone injection in the near future.  #5: Return to clinic in 5 months  #6: Patient turns 66 on 12/26/2016 and is due for bone density. Although we would like to refer the patient to [facility] imaging for baseline bone density per patient's request, we will be unable to do so due to conflict of interest. Patient has elected to have her PCP refer her there. We will review the bone density report once completed. Pt will make appt for review of DEXA once completed.  Orders: No orders of the defined types were placed in this encounter.  Meds ordered this encounter  Medications  . methotrexate (RHEUMATREX) 2.5 MG tablet    Sig: TAKE 4 TABLETS BY MOUTH 1 TIME EVERY WEEK    Dispense:  48 tablet    Refill:  0    Order Specific Question:   Supervising Provider    Answer:   Lyda Perone    Face-to-face time spent with patient was 30 minutes. 50% of time was spent in counseling and coordination of care.  Follow-Up Instructions: Return in about 5 months (around 06/20/2017) for RA,.   Eliezer Lofts, PA-C  Note - This record has been created using [employer].  Chart creation errors have been sought, but may not always  have been located. Such creation errors do not reflect on  the standard of medical care.

## 2017-01-17 NOTE — Progress Notes (Signed)
Will discuss labs tomorrow at follow-up visit

## 2017-01-18 ENCOUNTER — Ambulatory Visit: Payer: BC Managed Care – PPO | Admitting: Rheumatology

## 2017-01-18 ENCOUNTER — Ambulatory Visit (INDEPENDENT_AMBULATORY_CARE_PROVIDER_SITE_OTHER): Payer: BC Managed Care – PPO | Admitting: Rheumatology

## 2017-01-18 ENCOUNTER — Encounter: Payer: Self-pay | Admitting: Rheumatology

## 2017-01-18 VITALS — BP 142/80 | HR 78 | Resp 16 | Ht 67.5 in | Wt 255.0 lb

## 2017-01-18 DIAGNOSIS — M0579 Rheumatoid arthritis with rheumatoid factor of multiple sites without organ or systems involvement: Secondary | ICD-10-CM

## 2017-01-18 DIAGNOSIS — M5030 Other cervical disc degeneration, unspecified cervical region: Secondary | ICD-10-CM

## 2017-01-18 DIAGNOSIS — E119 Type 2 diabetes mellitus without complications: Secondary | ICD-10-CM

## 2017-01-18 DIAGNOSIS — M47812 Spondylosis without myelopathy or radiculopathy, cervical region: Secondary | ICD-10-CM

## 2017-01-18 DIAGNOSIS — M170 Bilateral primary osteoarthritis of knee: Secondary | ICD-10-CM | POA: Diagnosis not present

## 2017-01-18 DIAGNOSIS — Z79899 Other long term (current) drug therapy: Secondary | ICD-10-CM | POA: Diagnosis not present

## 2017-01-18 DIAGNOSIS — I10 Essential (primary) hypertension: Secondary | ICD-10-CM

## 2017-01-18 MED ORDER — METHOTREXATE 2.5 MG PO TABS: ORAL_TABLET | ORAL | 0 refills | Status: DC

## 2017-03-01 ENCOUNTER — Other Ambulatory Visit: Payer: Self-pay | Admitting: Obstetrics and Gynecology

## 2017-03-01 DIAGNOSIS — E2839 Other primary ovarian failure: Secondary | ICD-10-CM

## 2017-03-05 ENCOUNTER — Emergency Department (HOSPITAL_COMMUNITY): Payer: BC Managed Care – PPO

## 2017-03-05 ENCOUNTER — Emergency Department (HOSPITAL_COMMUNITY)
Admission: EM | Admit: 2017-03-05 | Discharge: 2017-03-05 | Disposition: A | Discharge status: Home / Self Care | Payer: BC Managed Care – PPO | Attending: Emergency Medicine | Admitting: Emergency Medicine

## 2017-03-05 ENCOUNTER — Encounter (HOSPITAL_COMMUNITY): Payer: Self-pay | Admitting: Emergency Medicine

## 2017-03-05 DIAGNOSIS — E119 Type 2 diabetes mellitus without complications: Secondary | ICD-10-CM | POA: Insufficient documentation

## 2017-03-05 DIAGNOSIS — R0782 Intercostal pain: Secondary | ICD-10-CM | POA: Diagnosis present

## 2017-03-05 DIAGNOSIS — Z79899 Other long term (current) drug therapy: Secondary | ICD-10-CM | POA: Insufficient documentation

## 2017-03-05 DIAGNOSIS — I10 Essential (primary) hypertension: Secondary | ICD-10-CM | POA: Insufficient documentation

## 2017-03-05 DIAGNOSIS — R0789 Other chest pain: Secondary | ICD-10-CM | POA: Diagnosis not present

## 2017-03-05 DIAGNOSIS — Z7984 Long term (current) use of oral hypoglycemic drugs: Secondary | ICD-10-CM | POA: Diagnosis not present

## 2017-03-05 HISTORY — DX: Unspecified osteoarthritis, unspecified site: M19.90

## 2017-03-05 HISTORY — DX: Rheumatoid arthritis, unspecified: M06.9

## 2017-03-05 MED ORDER — ACETAMINOPHEN 325 MG PO TABS
650.0000 mg | ORAL_TABLET | Freq: Once | ORAL | Status: AC
  Administered 2017-03-05: 325 mg via ORAL
  Filled 2017-03-05: qty 2

## 2017-03-05 NOTE — ED Triage Notes (Signed)
Patient reports left upper abdominal pain which began 1 week ago after lifting heavy basket of magazines.  Patient reports the pain has been intermittent and did subside for "a few days" but came back yesterday and states that when she stretches the pain is worse.  Denies nausea, vomiting, dizziness, chest pain, shortness of breath.

## 2017-03-05 NOTE — Discharge Instructions (Signed)
As discussed, your evaluation today has been largely reassuring.  But, it is important that you monitor your condition carefully, and do not hesitate to return to the ED if you develop new, or concerning changes in your condition.  Please follow-up with your physician for appropriate ongoing care.  Please take the provided description of today's CT findings to your physician

## 2017-03-05 NOTE — ED Provider Notes (Addendum)
[facility]-EMERGENCY DEPT Provider Note   CSN: [id number] Arrival date & time: 03/05/17  0755     History   Chief Complaint Chief Complaint  Patient presents with  . Abdominal Pain    HPI Jennifer Horn is a 66 y.o. female.  HPI  Patient presents with concern of left upper quadrant abdominal pain, left lower loss chest pain. Pain began about 4 days ago, after the patient reached abnormally Since that time she has had pain focally in the left infracostal area. Pain is sore, severe, seemingly worse with different motion, though inconsistently so. She is taking no new medication for pain relief. She denies new dyspnea, other chest pain, abdominal pain, nausea, anorexia, diet changes, nausea, vomiting, diarrhea, fevers, chills.  Past Medical History:  Diagnosis Date  . Anemia   . Diabetes mellitus without complication (H[facility])   . Hypertension   . Osteoarthritis   . Rheumatoid arthritis [facility])     Patient Active Problem List   Diagnosis Date Noted  . DJD (degenerative joint disease), cervical 01/16/2017  . Primary osteoarthritis of both knees 01/16/2017  . Acute pain of right knee 09/26/2016  . Diabetes ([facility]) 08/21/2016  . Hypertension 08/21/2016  . Rheumatoid arthritis with rheumatoid factor of multiple sites without organ or systems involvement ([facility]) 08/19/2016  . High risk medication use 08/19/2016  . Varicose veins of lower extremities with other complications 03/25/2014    Past Surgical History:  Procedure Laterality Date  . MOUTH SURGERY      OB History    No data available       Home Medications    Prior to Admission medications   Medication Sig Start Date End Date Taking? Authorizing Provider  cetirizine (ZYRTEC) 10 MG tablet Take 10 mg by mouth daily.   Yes [provider]  fluocinolone (VANOS) 0.01 % cream Apply topically. 08/17/16  Yes [provider]  Fluocinonide Emulsified Base 0.05 % CREA Apply sparingly to affected area twice a  day 08/21/16  Yes [provider]  fluticasone (FLONASE) 50 MCG/ACT nasal spray Place into both nostrils daily.   Yes [provider]  folic acid (FOLVITE) 1 MG tablet Take 1 tablet (1 mg total) by mouth daily. 01/04/17  Yes Deveshwar, Janalyn Rouse, MD  furosemide (LASIX) 20 MG tablet Take by mouth daily.  03/16/14  Yes [provider]  metFORMIN (GLUCOPHAGE) 500 MG tablet Take 250 mg by mouth 2 (two) times daily with a meal.   Yes [provider]  methotrexate (RHEUMATREX) 2.5 MG tablet TAKE 4 TABLETS BY MOUTH 1 TIME EVERY WEEK 01/18/17  Yes Panwala, Naitik, PA-C  Multiple Vitamins-Minerals (MULTIVITAMIN WITH MINERALS) tablet Take 1 tablet by mouth daily.   Yes [provider]  Multiple Vitamins-Minerals (PRESERVISION AREDS PO) Take by mouth.   Yes [provider]  OVER THE COUNTER MEDICATION Apply 1 drop to eye at bedtime as needed (for dry eyes).   Yes [provider]  ramipril (ALTACE) 2.5 MG capsule Take 2.5 mg by mouth daily.   Yes [provider]  ranitidine (ZANTAC) 150 MG tablet Take 150 mg by mouth 2 (two) times daily as needed for heartburn.   Yes [provider]  sitaGLIPtin (JANUVIA) 100 MG tablet Take 100 mg by mouth daily.   Yes [provider]  vitamin E 400 UNIT capsule Take 400 Units by mouth daily.   Yes [provider]    Family History Family History  Problem Relation Age of Onset  . Diabetes  Mother   . Hyperlipidemia Mother   . Hypertension Mother   . Hypertension Father     Social History Social History  Substance Use Topics  . Smoking status: Never Smoker  . Smokeless tobacco: Never Used  . Alcohol use Yes     Comment: Occassionally     Allergies   Levofloxacin and Quinolones   Review of Systems Review of Systems  Constitutional:       Per HPI, otherwise negative  HENT:       Per HPI, otherwise negative  Respiratory:       Per HPI, otherwise negative    Cardiovascular:       Per HPI, otherwise negative  Gastrointestinal: Negative for vomiting.  Endocrine:       Negative aside from HPI  Genitourinary:       Neg aside from HPI   Musculoskeletal:       Per HPI, otherwise negative  Skin: Negative.   Allergic/Immunologic: Positive for immunocompromised state.       Methotrexate for rheumatoid disease  Neurological: Negative for syncope.     Physical Exam Updated Vital Signs BP (!) 187/90 (BP Location: Right Arm)   Pulse 83   Temp 98.2 F (36.8 C)   Resp 18   Ht 5\' 7"  (1.702 m)   Wt 245 lb (111.1 kg)   SpO2 98%   BMI 38.37 kg/m   Physical Exam  Constitutional: She is oriented to person, place, and time. She appears well-developed and well-nourished. No distress.  HENT:  Head: Normocephalic and atraumatic.  Eyes: Conjunctivae and EOM are normal.  Cardiovascular: Normal rate and regular rhythm.   Pulmonary/Chest: Effort normal and breath sounds normal. No stridor. No respiratory distress.  No appreciable deformity of the left lower chest wall, the patient describes pain in this area.   Abdominal: She exhibits no distension and no mass. There is no tenderness. There is no guarding.  Musculoskeletal: She exhibits no edema.  Neurological: She is alert and oriented to person, place, and time. No cranial nerve deficit.  Skin: Skin is warm and dry.  Psychiatric: She has a normal mood and affect.  Nursing note and vitals reviewed.    ED Treatments / Results   EKG  EKG Interpretation  Date/Time:  Monday Mar 05 2017 08:21:20 EDT Ventricular Rate:  82 PR Interval:    QRS Duration: 87 QT Interval:  352 QTC Calculation: 412 R Axis:   25 Text Interpretation:  Sinus rhythm Abnormal R-wave progression, early transition Baseline wander in lead(s) V1 V2 Otherwise within normal limits Confirmed by Gerhard MunchLockwood, Gizelle Whetsel [phone] on 03/05/2017 9:21:37 AM       X-ray reviewed, agree with the interpretation.Only reviewed the CT imaging  as well, agree with the interpretation as well.   On subsequent exam the patient has no substantial ongoing applies per We discussed initial findings, concerning for possible lesion, CT scan will be performed.  Update:, Now, CT results were discussed with the patient and her family members.  Although the patient's evaluation for her acute new issue is reassuring, with concerning CT, the patient will follow-up as an outpatient for consideration of additional studies, including PET versus MRI. Patient confirms that she is nonsmoker, does take immunosuppressants daily for her rheumatoid disease. She also denies other stigmata associated with malignancy.    Procedures Procedures (including critical care time)  Medications Ordered in ED Medications  acetaminophen (TYLENOL) tablet 650 mg (not administered)     Initial Impression / Assessment and  Plan / ED Course  I have reviewed the triage vital signs and the nursing notes.  Pertinent labs & imaging results that were available during my care of the patient were reviewed by me and considered in my medical decision making (see chart for details).  This well-appearing female presents with left upper quadrant, left infracostal region pain.  Patient has no other respiratory complaints, no other GI complaints, and there suspicion for musculoskeletal etiology given the reassuring EKG, x-ray. Patient has not used any medication for sustained pain relief. Given the reassuring findings, there is low suspicion for new acute cardiac, pulmonary, gastroenterologic phenomena. Patient discharged in stable condition with appropriate outpatient follow-up given the new CT abnormality.  Patient provided written CT report to expedite follow-up.  Left lower chest pain Pulmonary nodule   Gerhard Munch, MD 03/05/17 1129    Gerhard Munch, MD 03/05/17 1131

## 2017-03-16 ENCOUNTER — Other Ambulatory Visit: Payer: Self-pay | Admitting: Specialist

## 2017-03-19 ENCOUNTER — Other Ambulatory Visit: Payer: Self-pay | Admitting: Specialist

## 2017-03-19 DIAGNOSIS — R1084 Generalized abdominal pain: Secondary | ICD-10-CM

## 2017-03-19 DIAGNOSIS — R911 Solitary pulmonary nodule: Secondary | ICD-10-CM

## 2017-03-31 ENCOUNTER — Other Ambulatory Visit: Payer: Self-pay | Admitting: Rheumatology

## 2017-04-02 ENCOUNTER — Other Ambulatory Visit (HOSPITAL_COMMUNITY): Payer: Self-pay | Admitting: Specialist

## 2017-04-02 ENCOUNTER — Inpatient Hospital Stay
Admission: RE | Admit: 2017-04-02 | Discharge: 2017-04-02 | Disposition: A | Discharge status: Home / Self Care | Payer: BC Managed Care – PPO | Source: Ambulatory Visit | Attending: Specialist | Admitting: Specialist

## 2017-04-02 ENCOUNTER — Other Ambulatory Visit: Payer: BC Managed Care – PPO

## 2017-04-02 DIAGNOSIS — R911 Solitary pulmonary nodule: Secondary | ICD-10-CM

## 2017-04-02 NOTE — Telephone Encounter (Signed)
Last Visit: 01/18/17 Next Visit: 06/20/17  Okay to refill Folic Acid?

## 2017-04-11 ENCOUNTER — Ambulatory Visit (HOSPITAL_COMMUNITY): Payer: BC Managed Care – PPO

## 2017-04-16 ENCOUNTER — Ambulatory Visit (HOSPITAL_COMMUNITY)
Admission: RE | Admit: 2017-04-16 | Discharge: 2017-04-16 | Disposition: A | Discharge status: Home / Self Care | Payer: BC Managed Care – PPO | Source: Ambulatory Visit | Attending: Specialist | Admitting: Specialist

## 2017-04-16 DIAGNOSIS — J329 Chronic sinusitis, unspecified: Secondary | ICD-10-CM | POA: Insufficient documentation

## 2017-04-16 DIAGNOSIS — K449 Diaphragmatic hernia without obstruction or gangrene: Secondary | ICD-10-CM | POA: Insufficient documentation

## 2017-04-16 DIAGNOSIS — I700 Atherosclerosis of aorta: Secondary | ICD-10-CM | POA: Insufficient documentation

## 2017-04-16 DIAGNOSIS — M533 Sacrococcygeal disorders, not elsewhere classified: Secondary | ICD-10-CM | POA: Diagnosis not present

## 2017-04-16 DIAGNOSIS — M160 Bilateral primary osteoarthritis of hip: Secondary | ICD-10-CM | POA: Diagnosis not present

## 2017-04-16 DIAGNOSIS — E049 Nontoxic goiter, unspecified: Secondary | ICD-10-CM | POA: Diagnosis not present

## 2017-04-16 DIAGNOSIS — D71 Functional disorders of polymorphonuclear neutrophils: Secondary | ICD-10-CM | POA: Diagnosis not present

## 2017-04-16 DIAGNOSIS — M899 Disorder of bone, unspecified: Secondary | ICD-10-CM | POA: Diagnosis not present

## 2017-04-16 DIAGNOSIS — R911 Solitary pulmonary nodule: Secondary | ICD-10-CM | POA: Insufficient documentation

## 2017-04-16 LAB — GLUCOSE, CAPILLARY: GLUCOSE-CAPILLARY: 173 mg/dL — AB (ref 65–99)

## 2017-04-16 MED ORDER — FLUDEOXYGLUCOSE F - 18 (FDG) INJECTION
11.9000 | Freq: Once | INTRAVENOUS | Status: AC | PRN
  Administered 2017-04-16: 11.9 via INTRAVENOUS

## 2017-04-19 ENCOUNTER — Telehealth: Payer: Self-pay | Admitting: Rheumatology

## 2017-04-19 MED ORDER — METHOTREXATE 2.5 MG PO TABS: ORAL_TABLET | ORAL | 0 refills | Status: DC

## 2017-04-19 NOTE — Telephone Encounter (Signed)
Last Visit: 01/18/17 Next Visit: 06/20/17 Labs: 01/16/17 stable   Okay to refill per Dr.Deveshwar

## 2017-04-19 NOTE — Telephone Encounter (Signed)
Patient calling to request a refill on her MTX. Patient [place] [facility] in [place].

## 2017-04-23 ENCOUNTER — Other Ambulatory Visit: Payer: Self-pay

## 2017-04-23 ENCOUNTER — Other Ambulatory Visit: Payer: Self-pay | Admitting: *Deleted

## 2017-04-23 DIAGNOSIS — Z79899 Other long term (current) drug therapy: Secondary | ICD-10-CM

## 2017-04-23 LAB — CBC WITH DIFFERENTIAL/PLATELET
BASOS ABS: 0 {cells}/uL (ref 0–200)
Basophils Relative: 0 %
EOS ABS: 86 {cells}/uL (ref 15–500)
Eosinophils Relative: 1 %
HCT: 36.3 % (ref 35.0–45.0)
Hemoglobin: 11.7 g/dL (ref 11.7–15.5)
LYMPHS PCT: 10 %
Lymphs Abs: 860 cells/uL (ref 850–3900)
MCH: 31.2 pg (ref 27.0–33.0)
MCHC: 32.2 g/dL (ref 32.0–36.0)
MCV: 96.8 fL (ref 80.0–100.0)
MONOS PCT: 4 %
MPV: 9.9 fL (ref 7.5–12.5)
Monocytes Absolute: 344 cells/uL (ref 200–950)
NEUTROS ABS: 7310 {cells}/uL (ref 1500–7800)
NEUTROS PCT: 85 %
PLATELETS: 223 10*3/uL (ref 140–400)
RBC: 3.75 MIL/uL — ABNORMAL LOW (ref 3.80–5.10)
RDW: 15 % (ref 11.0–15.0)
WBC: 8.6 10*3/uL (ref 3.8–10.8)

## 2017-04-23 LAB — COMPLETE METABOLIC PANEL WITH GFR
ALK PHOS: 97 U/L (ref 33–130)
ALT: 8 U/L (ref 6–29)
AST: 15 U/L (ref 10–35)
Albumin: 3.9 g/dL (ref 3.6–5.1)
BILIRUBIN TOTAL: 0.4 mg/dL (ref 0.2–1.2)
BUN: 19 mg/dL (ref 7–25)
CO2: 26 mmol/L (ref 20–31)
Calcium: 9.1 mg/dL (ref 8.6–10.4)
Chloride: 103 mmol/L (ref 98–110)
Creat: 1.24 mg/dL — ABNORMAL HIGH (ref 0.50–0.99)
GFR, EST AFRICAN AMERICAN: 53 mL/min — AB (ref >=60)
GFR, EST NON AFRICAN AMERICAN: 46 mL/min — AB (ref >=60)
Glucose, Bld: 203 mg/dL — ABNORMAL HIGH (ref 65–99)
POTASSIUM: 5 mmol/L (ref 3.5–5.3)
Sodium: 138 mmol/L (ref 135–146)
TOTAL PROTEIN: 6.8 g/dL (ref 6.1–8.1)

## 2017-04-24 NOTE — Progress Notes (Signed)
There is mild increase in her creatinine. Please advise her to decrease methotrexate to 3 tablets per week.

## 2017-06-19 DIAGNOSIS — R768 Other specified abnormal immunological findings in serum: Secondary | ICD-10-CM | POA: Insufficient documentation

## 2017-06-19 NOTE — Progress Notes (Signed)
Office Visit Note  Patient: Jennifer Horn             Date of Birth: 27-Jul-1951           MRN: [id number]             PCP: Gordan PaymentGrisso, Greg A., MD Referring: Gordan PaymentGrisso, Greg A., MD Visit Date: 06/20/2017 Occupation: @GUAROCC @    Subjective:  Pain in knee joints and feet.   History of Present Illness: Jennifer Horn is a 66 y.o. female with history of sero positive rheumatoid arthritis. She is on low-dose methotrexate. According to her it is controlling her hand symptoms quite well. Although she continues to have discomfort in her knee joints and her feet. She states in May she had an episode of left lower rib cage discomfort. She went to [facility] emergency room for that. After a chest x-ray CT scan of her chest was performed which showed some pulmonary nodules. It was followed up by PET scan. The recommendations were to repeat CT scan in one year to see the stability of the nodule. She has seen a pulmonologist since then.   Activities of Daily Living:  Patient reports morning stiffness for 0 minutes.   Patient Denies nocturnal pain.  Difficulty dressing/grooming: Denies Difficulty climbing stairs: Reports Difficulty getting out of chair: Reports Difficulty using hands for taps, buttons, cutlery, and/or writing: Denies   Review of Systems  Constitutional: Positive for fatigue. Negative for night sweats, weight gain, weight loss and weakness.  HENT: Negative for mouth sores, trouble swallowing, trouble swallowing, mouth dryness and nose dryness.   Eyes: Negative for pain, redness, visual disturbance and dryness.  Respiratory: Negative for cough, shortness of breath and difficulty breathing.   Cardiovascular: Positive for hypertension. Negative for chest pain, palpitations, irregular heartbeat and swelling in legs/feet.  Gastrointestinal: Negative for blood in stool, constipation and diarrhea.  Endocrine: Negative for increased urination.  Genitourinary: Negative for vaginal  dryness.  Musculoskeletal: Positive for arthralgias and joint pain. Negative for joint swelling, myalgias, muscle weakness, morning stiffness, muscle tenderness and myalgias.  Skin: Negative for color change, rash, hair loss, skin tightness, ulcers and sensitivity to sunlight.  Allergic/Immunologic: Negative for susceptible to infections.  Neurological: Negative for dizziness, memory loss and night sweats.  Hematological: Negative for swollen glands.  Psychiatric/Behavioral: Negative for depressed mood and sleep disturbance. The patient is not nervous/anxious.     PMFS History:  Patient Active Problem List   Diagnosis Date Noted  . ANA positive 06/19/2017  . DJD (degenerative joint disease), cervical 01/16/2017  . Primary osteoarthritis of both knees 01/16/2017  . Acute pain of right knee 09/26/2016  . Diabetes (HCC) 08/21/2016  . Hypertension 08/21/2016  . Rheumatoid arthritis with rheumatoid factor of multiple sites without organ or systems involvement (HCC) 08/19/2016  . High risk medication use 08/19/2016  . Varicose veins of lower extremities with other complications 03/25/2014    Past Medical History:  Diagnosis Date  . Anemia   . Diabetes mellitus without complication (HCC)   . Hypertension   . Osteoarthritis   . Rheumatoid arthritis (HCC)     Family History  Problem Relation Age of Onset  . Diabetes Mother   . Hyperlipidemia Mother   . Hypertension Mother   . Hypertension Father    Past Surgical History:  Procedure Laterality Date  . MOUTH SURGERY     Social History   Social History Narrative  . No narrative on file     Objective: Vital Signs:  BP (!) 150/76   Pulse 78   Resp 14   Ht 5' 7.5" (1.715 m)   Wt 254 lb (115.2 kg)   BMI 39.19 kg/m    Physical Exam  Constitutional: She is oriented to person, place, and time. She appears well-developed and well-nourished.  HENT:  Head: Normocephalic and atraumatic.  Eyes: Conjunctivae and EOM are normal.    Neck: Normal range of motion.  Cardiovascular: Normal rate, regular rhythm, normal heart sounds and intact distal pulses.   Pulmonary/Chest: Effort normal and breath sounds normal.  Abdominal: Soft. Bowel sounds are normal.  Lymphadenopathy:    She has no cervical adenopathy.  Neurological: She is alert and oriented to person, place, and time.  Skin: Skin is warm and dry. Capillary refill takes less than 2 seconds.  Psychiatric: She has a normal mood and affect. Her behavior is normal.  Nursing note and vitals reviewed.    Musculoskeletal Exam: C-spine and thoracic spine limited range of motion. Shoulder joints elbow joints are good range of motion. She has no synovitis over MCPs PIPs and DIP joints. She continues to have some discomfort in her knee joints due to underlying osteoarthritis. She had no warmth swelling or effusion in her knee joints and no swelling or ankle joints.  CDAI Exam: CDAI Homunculus Exam:   Joint Counts:  CDAI Tender Joint count: 0 CDAI Swollen Joint count: 0  Global Assessments:  Patient Global Assessment: 4 Provider Global Assessment: 2  CDAI Calculated Score: 6    Investigation: Findings:  May 2015:  Comprehensive metabolic panel showed creatinine of 1.21, GFR 48.  CBC showed hemoglobin of 11.2.  CCP was negative.  Hepatitis panel, G6PD, CK, TSH, SPEP and TB Gold were normal.  Her IgA was mildly elevated.    CBC Latest Ref Rng & Units 04/23/2017 01/16/2017 10/17/2016  WBC 3.8 - 10.8 K/uL 8.6 9.3 9.5  Hemoglobin 11.7 - 15.5 g/dL 14.7 82.9 56.2  Hematocrit 35.0 - 45.0 % 36.3 37.1 37.2  Platelets 140 - 400 K/uL 223 241 237    CMP Latest Ref Rng & Units 04/23/2017 01/16/2017 10/17/2016  Glucose 65 - 99 mg/dL 130(Q) 657(Q) 469(G)  BUN 7 - 25 mg/dL 19 20 22   Creatinine 0.50 - 0.99 mg/dL 2.95(M) 8.41(L) 2.44(W)  Sodium 135 - 146 mmol/L 138 138 138  Potassium 3.5 - 5.3 mmol/L 5.0 5.1 5.2  Chloride 98 - 110 mmol/L 103 102 103  CO2 20 - 31 mmol/L 26 25 27    Calcium 8.6 - 10.4 mg/dL 9.1 9.4 9.4  Total Protein 6.1 - 8.1 g/dL 6.8 7.0 7.0  Total Bilirubin 0.2 - 1.2 mg/dL 0.4 0.3 0.3  Alkaline Phos 33 - 130 U/L 97 92 106  AST 10 - 35 U/L 15 15 16   ALT 6 - 29 U/L 8 8 6     Imaging: No results found.  Speciality Comments: No specialty comments available.    Procedures:  No procedures performed Allergies: Levofloxacin and Quinolones   Assessment / Plan:     Visit Diagnoses: Rheumatoid arthritis with rheumatoid factor of multiple sites without organ or systems involvement -CCP (HCC) - +ANA her rheumatoid arthritis seems to be quite well-controlled without any synovitis. Although due to elevated creatinine and we discussed different treatment options. She would also prefer to change medication. After indications side effects contraindications were discussed and handout was given consent was taken [place]. The plan is to start her on Arava 10 mg by mouth daily. We will check labs in  2 weeks after starting Arava and then every 2 months to monitor for drug toxicity. She will discontinue methotrexate. I will also check labs today as her creatinine was elevated before.  High risk medication use - Methotrexate 3/week ,Folic acid 1 mg po qd( MTX decreased to 3tabs po q wk in 07/18) - Plan: CBC with Differential/Platelet, COMPLETE METABOLIC PANEL WITH GFR, CBC with Differential/Platelet, COMPLETE METABOLIC PANEL WITH GFR  Primary osteoarthritis of both knees: She continues to have normal. Discomfort in her knee joints and stiffness. She states Tylenol is good enough to manage her pain. She's had cortisone injection in the past. As she is diabetic I would discourage the use of cortisone shots. If need be Visco supplement injections be better choice in future.  DJD (degenerative joint disease), cervical: Some discomfort  History of hypertension: Her systolic blood pressure is elevated today have advised to monitor blood pressure.  History of diabetes  mellitus  History of renal insufficiency - GFR high 40s- low 50s   Obesity: BMI 39.19 Weight loss diet and exercise was also discussed at length.   Orders: Orders Placed This Encounter  Procedures  . CBC with Differential/Platelet  . COMPLETE METABOLIC PANEL WITH GFR  . CBC with Differential/Platelet  . COMPLETE METABOLIC PANEL WITH GFR   No orders of the defined types were placed in this encounter.   Face-to-face time spent with patient was 30 minutes. Greater than 50% of time was spent in counseling and coordination of care.  Follow-Up Instructions: No Follow-up on file.   Pollyann Savoy, MD  Note - This record has been created using Animal nutritionist.  Chart creation errors have been sought, but may not always  have been located. Such creation errors do not reflect on  the standard of medical care.

## 2017-06-20 ENCOUNTER — Encounter: Payer: Self-pay | Admitting: Rheumatology

## 2017-06-20 ENCOUNTER — Ambulatory Visit: Payer: BC Managed Care – PPO | Admitting: Rheumatology

## 2017-06-20 ENCOUNTER — Other Ambulatory Visit: Payer: Self-pay | Admitting: Rheumatology

## 2017-06-20 ENCOUNTER — Encounter (INDEPENDENT_AMBULATORY_CARE_PROVIDER_SITE_OTHER): Payer: Self-pay

## 2017-06-20 ENCOUNTER — Ambulatory Visit (INDEPENDENT_AMBULATORY_CARE_PROVIDER_SITE_OTHER): Payer: BC Managed Care – PPO | Admitting: Rheumatology

## 2017-06-20 VITALS — BP 150/76 | HR 78 | Resp 14 | Ht 67.5 in | Wt 254.0 lb

## 2017-06-20 DIAGNOSIS — M5030 Other cervical disc degeneration, unspecified cervical region: Secondary | ICD-10-CM

## 2017-06-20 DIAGNOSIS — M47812 Spondylosis without myelopathy or radiculopathy, cervical region: Secondary | ICD-10-CM

## 2017-06-20 DIAGNOSIS — M0579 Rheumatoid arthritis with rheumatoid factor of multiple sites without organ or systems involvement: Secondary | ICD-10-CM

## 2017-06-20 DIAGNOSIS — Z87448 Personal history of other diseases of urinary system: Secondary | ICD-10-CM | POA: Diagnosis not present

## 2017-06-20 DIAGNOSIS — M170 Bilateral primary osteoarthritis of knee: Secondary | ICD-10-CM

## 2017-06-20 DIAGNOSIS — Z8639 Personal history of other endocrine, nutritional and metabolic disease: Secondary | ICD-10-CM | POA: Diagnosis not present

## 2017-06-20 DIAGNOSIS — Z8679 Personal history of other diseases of the circulatory system: Secondary | ICD-10-CM

## 2017-06-20 DIAGNOSIS — Z79899 Other long term (current) drug therapy: Secondary | ICD-10-CM

## 2017-06-20 MED ORDER — LEFLUNOMIDE 10 MG PO TABS: 10.0000 mg | ORAL_TABLET | Freq: Every day | ORAL | 2 refills | Status: DC

## 2017-06-20 NOTE — Progress Notes (Signed)
Patient was counseled on the purpose, proper use, and adverse effects of leflunomide including risk of infection, nausea/diarrhea/weight loss, increase in blood pressure, rash, hair loss, tingling in the hands and feet, and signs and symptoms of interstitial lung disease.  Discussed the importance of frequent monitoring of liver function and blood count.   Provided patient with educational materials on leflunomide and answered all questions.  Patient consented to [place] use, and consent will be uploaded into the media tab.

## 2017-06-20 NOTE — Addendum Note (Signed)
Addended byCaffie Damme: Calab Sachse W on: 06/20/2017 09:24 AM   Modules accepted: Orders

## 2017-06-20 NOTE — Addendum Note (Signed)
Addended byCaffie Damme: LITTRELL, AMY W on: 06/20/2017 09:39 AM   Modules accepted: Orders

## 2017-06-20 NOTE — Patient Instructions (Addendum)
Standing Labs We placed an order today for your standing lab work.    Please come back and get your standing labs in 2 weeks, 2 months and then every 2 months  We have open lab Monday through Friday from 8:30-11:30 AM and 1:30-4 PM at the office of Dr. Pollyann Savoy.   The office is located at [address], [address], [place], [place] [address] No appointment is necessary.   Labs are drawn by [facility] [employer].  You may receive a bill from [facility] for your lab work. If you have any questions regarding directions or hours of operation,  please call [phone].    Discontinue methotrexate   Leflunomide tablets What is this medicine? LEFLUNOMIDE (le FLOO na mide) is for rheumatoid arthritis. This medicine may be used for other purposes; ask your health care provider or pharmacist if you have questions. COMMON BRAND NAME(S): Arava What should I tell my health care provider before I take this medicine? They need to know if you have any of these conditions: -alcoholism -bone marrow problems -fever or infection -immune system problems -kidney disease -liver disease -an unusual or allergic reaction to leflunomide, teriflunomide, other medicines, lactose, foods, dyes, or preservatives -pregnant or trying to get pregnant -breast-feeding How should I use this medicine? Take this medicine by mouth with a full glass of water. Follow the directions on the prescription label. Take your medicine at regular intervals. Do not take your medicine more often than directed. Do not stop taking except on your doctor's advice. Talk to your pediatrician regarding the use of this medicine in children. Special care may be needed. Overdosage: If you think you have taken too much of this medicine contact a poison control center or emergency room at once. NOTE: This medicine is only for you. Do not share this medicine with others. What if I miss a dose? If you miss a dose, take it as soon as you can. If it is  almost time for your next dose, take only that dose. Do not take double or extra doses. What may interact with this medicine? Do not take this medicine with any of the following medications: -teriflunomide This medicine may also interact with the following medications: -charcoal -cholestyramine -methotrexate -NSAIDs, medicines for pain and inflammation, like ibuprofen or naproxen -phenytoin -rifampin -tolbutamide -vaccines -warfarin This list may not describe all possible interactions. Give your health care provider a list of all the medicines, herbs, non-prescription drugs, or dietary supplements you use. Also tell them if you smoke, drink alcohol, or use illegal drugs. Some items may interact with your medicine. What should I watch for while using this medicine? Visit your doctor or health care professional for regular checks on your progress. You will need frequent blood checks while you are receiving the medicine. If you get a cold or other infection while receiving this medicine, call your doctor or health care professional. Do not treat yourself. The medicine may increase your risk of getting an infection. If you are a woman who has the potential to become pregnant, discuss birth control options with your doctor or health care professional. Bonita Quin must not be pregnant, and you must be using a reliable form of birth control. The medicine may harm an unborn baby. Immediately call your doctor if you think you might be pregnant. Alcoholic drinks may increase possible damage to your liver. Do not drink alcohol while taking this medicine. What side effects may I notice from receiving this medicine? Side effects that you should report  to your doctor or health care professional as soon as possible: -allergic reactions like skin rash, itching or hives, swelling of the face, lips, or tongue -cough -difficulty breathing or shortness of breath -fever, chills or any other sign of infection -redness,  blistering, peeling or loosening of the skin, including inside the mouth -unusual bleeding or bruising -unusually weak or tired -vomiting -yellowing of eyes or skin Side effects that usually do not require medical attention (report to your doctor or health care professional if they continue or are bothersome): -diarrhea -hair loss -headache -nausea This list may not describe all possible side effects. Call your doctor for medical advice about side effects. You may report side effects to FDA at 1-800-FDA-1088. Where should I keep my medicine? Keep out of the reach of children. Store at room temperature between 15 and 30 degrees C (59 and 86 degrees F). Protect from moisture and light. Throw away any unused medicine after the expiration date. NOTE: This sheet is a summary. It may not cover all possible information. If you have questions about this medicine, talk to your doctor, pharmacist, or health care provider.  2018 Elsevier/Gold Standard (2013-09-30 10:53:11)

## 2017-06-21 LAB — CBC WITH DIFFERENTIAL/PLATELET
BASOS PCT: 0.5 %
Basophils Absolute: 39 cells/uL (ref 0–200)
EOS PCT: 1.4 %
Eosinophils Absolute: 109 cells/uL (ref 15–500)
HEMATOCRIT: 34.7 % — AB (ref 35.0–45.0)
Hemoglobin: 11.7 g/dL (ref 11.7–15.5)
LYMPHS ABS: 850 {cells}/uL (ref 850–3900)
MCH: 31.6 pg (ref 27.0–33.0)
MCHC: 33.7 g/dL (ref 32.0–36.0)
MCV: 93.8 fL (ref 80.0–100.0)
MPV: 10.5 fL (ref 7.5–12.5)
Monocytes Relative: 7.4 %
NEUTROS ABS: 6224 {cells}/uL (ref 1500–7800)
NEUTROS PCT: 79.8 %
Platelets: 231 10*3/uL (ref 140–400)
RBC: 3.7 10*6/uL — AB (ref 3.80–5.10)
RDW: 13.2 % (ref 11.0–15.0)
Total Lymphocyte: 10.9 %
WBC mixed population: 577 cells/uL (ref 200–950)
WBC: 7.8 10*3/uL (ref 3.8–10.8)

## 2017-06-21 LAB — COMPLETE METABOLIC PANEL WITH GFR
AG Ratio: 1.4 (calc) (ref 1.0–2.5)
ALKALINE PHOSPHATASE (APISO): 102 U/L (ref 33–130)
ALT: 10 U/L (ref 6–29)
AST: 15 U/L (ref 10–35)
Albumin: 4.1 g/dL (ref 3.6–5.1)
BILIRUBIN TOTAL: 0.4 mg/dL (ref 0.2–1.2)
BUN / CREAT RATIO: 17 (calc) (ref 6–22)
BUN: 19 mg/dL (ref 7–25)
CALCIUM: 9.5 mg/dL (ref 8.6–10.4)
CHLORIDE: 102 mmol/L (ref 98–110)
CO2: 29 mmol/L (ref 20–32)
Creat: 1.13 mg/dL — ABNORMAL HIGH (ref 0.50–0.99)
GFR, Est African American: 59 mL/min/{1.73_m2} — ABNORMAL LOW (ref >=60)
GFR, Est Non African American: 51 mL/min/{1.73_m2} — ABNORMAL LOW (ref >=60)
GLUCOSE: 179 mg/dL — AB (ref 65–99)
Globulin: 3 g/dL (calc) (ref 1.9–3.7)
Potassium: 5 mmol/L (ref 3.5–5.3)
Sodium: 140 mmol/L (ref 135–146)
TOTAL PROTEIN: 7.1 g/dL (ref 6.1–8.1)

## 2017-06-21 NOTE — Progress Notes (Signed)
stable °

## 2017-06-26 ENCOUNTER — Other Ambulatory Visit: Payer: Self-pay | Admitting: Rheumatology

## 2017-06-26 NOTE — Telephone Encounter (Signed)
Last Visit: 06/20/17 Next Visit: 09/19/17  Okay to refill per Dr. Corliss Skainseveshwar

## 2017-07-02 ENCOUNTER — Other Ambulatory Visit: Payer: Self-pay

## 2017-07-02 DIAGNOSIS — Z79899 Other long term (current) drug therapy: Secondary | ICD-10-CM

## 2017-07-02 LAB — CBC WITH DIFFERENTIAL/PLATELET
BASOS ABS: 52 {cells}/uL (ref 0–200)
Basophils Relative: 0.7 %
EOS ABS: 133 {cells}/uL (ref 15–500)
Eosinophils Relative: 1.8 %
HEMATOCRIT: 36 % (ref 35.0–45.0)
Hemoglobin: 11.9 g/dL (ref 11.7–15.5)
Lymphs Abs: 777 cells/uL — ABNORMAL LOW (ref 850–3900)
MCH: 31.1 pg (ref 27.0–33.0)
MCHC: 33.1 g/dL (ref 32.0–36.0)
MCV: 94 fL (ref 80.0–100.0)
MPV: 10.4 fL (ref 7.5–12.5)
Monocytes Relative: 7.3 %
NEUTROS PCT: 79.7 %
Neutro Abs: 5898 cells/uL (ref 1500–7800)
PLATELETS: 213 10*3/uL (ref 140–400)
RBC: 3.83 10*6/uL (ref 3.80–5.10)
RDW: 13.2 % (ref 11.0–15.0)
TOTAL LYMPHOCYTE: 10.5 %
WBC mixed population: 540 cells/uL (ref 200–950)
WBC: 7.4 10*3/uL (ref 3.8–10.8)

## 2017-07-02 LAB — COMPLETE METABOLIC PANEL WITH GFR
AG RATIO: 1.3 (calc) (ref 1.0–2.5)
ALT: 7 U/L (ref 6–29)
AST: 13 U/L (ref 10–35)
Albumin: 4 g/dL (ref 3.6–5.1)
Alkaline phosphatase (APISO): 106 U/L (ref 33–130)
BILIRUBIN TOTAL: 0.3 mg/dL (ref 0.2–1.2)
BUN/Creatinine Ratio: 15 (calc) (ref 6–22)
BUN: 18 mg/dL (ref 7–25)
CHLORIDE: 102 mmol/L (ref 98–110)
CO2: 29 mmol/L (ref 20–32)
Calcium: 9.1 mg/dL (ref 8.6–10.4)
Creat: 1.18 mg/dL — ABNORMAL HIGH (ref 0.50–0.99)
GFR, EST AFRICAN AMERICAN: 56 mL/min/{1.73_m2} — AB (ref >=60)
GFR, EST NON AFRICAN AMERICAN: 48 mL/min/{1.73_m2} — AB (ref >=60)
GLOBULIN: 3 g/dL (ref 1.9–3.7)
Glucose, Bld: 236 mg/dL — ABNORMAL HIGH (ref 65–99)
POTASSIUM: 4.5 mmol/L (ref 3.5–5.3)
SODIUM: 139 mmol/L (ref 135–146)
Total Protein: 7 g/dL (ref 6.1–8.1)

## 2017-07-03 NOTE — Progress Notes (Signed)
Glucose is high. Other labs are  is stable. Please notify patient and fax results to her PCP.

## 2017-08-14 ENCOUNTER — Telehealth: Payer: Self-pay

## 2017-08-14 NOTE — Telephone Encounter (Signed)
She should discontinue Arava. We will have to discuss other treatment options during the office visit. Schedule an appointment.

## 2017-08-14 NOTE — Telephone Encounter (Signed)
Patient states the rash she is experiencing is splotchy and red on her chest and on her hip and ribs is very red. Patient states she has been scratching so bad that it is leaving bruises. Patient has been advised to take benadryl to help with the itching.  Patient states she never experienced these symptoms while taking MTX. Patient would like to know if she should switch back to MTX?

## 2017-08-14 NOTE — Telephone Encounter (Signed)
Patient called stating that she may be having a reaction to [place].  Patient has been experiencing a lot of itching to the point that it's leaving bruises.  This has been happening for about a week in a half.  Thought it may have been dry skin, but she doesn't think it is.  Itching on her hip, breastbone, and neck.  Cb# [phone].  Please advise.  Thank You.

## 2017-08-15 ENCOUNTER — Telehealth: Payer: Self-pay | Admitting: Rheumatology

## 2017-08-15 NOTE — Telephone Encounter (Signed)
Attempted to contact the patient and left message for patient to call the office.  

## 2017-08-15 NOTE — Telephone Encounter (Signed)
Left message for patient to call the office

## 2017-08-15 NOTE — Telephone Encounter (Signed)
Patient returning your call.

## 2017-08-15 NOTE — Telephone Encounter (Signed)
Spoke with patient and advised her to discontinue Arava. Patient has been scheduled for an appointment on 08/21/17 at 10:45 am to discuss treatment options.

## 2017-08-19 NOTE — Progress Notes (Signed)
Office Visit Note  Patient: Jennifer Horn             Date of Birth: August 16, 1951           MRN: [id number]             PCP: Gordan Payment., MD Referring: Gordan Payment., MD Visit Date: 08/21/2017 Occupation: @GUAROCC @    Subjective:  Rash and itching.   History of Present Illness: Coral Timme is a 66 y.o. female with history of rheumatoid arthritis. She was switched from methotrexate 3 tablets per week to Arava 10 mg by mouth daily after her last visit in September due to elevation of her creatinine. She states after taking RA which developed rash and itching. She stopped the Arava and symptoms are gradually resolving. She would like to try methotrexate again. She believes she was not drinking enough water and would like to try methotrexate again because she did really well on it in the past.he is currently not having any joint swelling.  Activities of Daily Living:  Patient reports morning stiffness for 0 minutes.   Patient Denies nocturnal pain.  Difficulty dressing/grooming: Denies Difficulty climbing stairs: Reports Difficulty getting out of chair: Denies Difficulty using hands for taps, buttons, cutlery, and/or writing: Denies   Review of Systems  Constitutional: Negative.  Negative for fatigue and weakness.  HENT: Negative.  Negative for mouth dryness.   Eyes: Negative.  Negative for dryness.  Respiratory: Negative.  Negative for cough and shortness of breath.   Cardiovascular: Positive for hypertension. Negative for chest pain, palpitations and swelling in legs/feet.  Gastrointestinal: Negative.  Negative for blood in stool, constipation and diarrhea.  Musculoskeletal: Positive for arthralgias, joint pain, myalgias and myalgias. Negative for joint swelling, muscle weakness, morning stiffness and muscle tenderness.  Skin: Negative for rash, hair loss and sensitivity to sunlight.  Neurological: Negative.  Negative for dizziness, numbness and headaches.    Psychiatric/Behavioral: Negative.  Negative for depressed mood and sleep disturbance.    PMFS History:  Patient Active Problem List   Diagnosis Date Noted  . ANA positive 06/19/2017  . DJD (degenerative joint disease), cervical 01/16/2017  . Primary osteoarthritis of both knees 01/16/2017  . Acute pain of right knee 09/26/2016  . Diabetes (HCC) 08/21/2016  . Hypertension 08/21/2016  . Rheumatoid arthritis with rheumatoid factor of multiple sites without organ or systems involvement (HCC) 08/19/2016  . High risk medication use 08/19/2016  . Varicose veins of lower extremities with other complications 03/25/2014    Past Medical History:  Diagnosis Date  . Anemia   . Diabetes mellitus without complication (HCC)   . Hypertension   . Osteoarthritis   . Rheumatoid arthritis (HCC)     Family History  Problem Relation Age of Onset  . Diabetes Mother   . Hyperlipidemia Mother   . Hypertension Mother   . Hypertension Father    Past Surgical History:  Procedure Laterality Date  . MOUTH SURGERY     Social History   Social History Narrative  . Not on file     Objective: Vital Signs: BP (!) 173/91 (BP Location: Left Arm, Patient Position: Sitting, Cuff Size: Normal)   Pulse 79   Ht 5\' 7"  (1.702 m)   Wt 250 lb (113.4 kg)   BMI 39.16 kg/m    Physical Exam   Musculoskeletal Exam: C-spine and thoracic spine and lumbar spine some limitation without much discomfort. Shoulder joints elbow joints wrist joints are good range of motion. She  has no synovitis or wrists joint MCPs PIPs. Hip joints knee joints ankles MTPs PIPs with good range of motion with no synovitis.  CDAI Exam: CDAI Homunculus Exam:   Joint Counts:  CDAI Tender Joint count: 0 CDAI Swollen Joint count: 0  Global Assessments:  Patient Global Assessment: 5 Provider Global Assessment: 2  CDAI Calculated Score: 7    Investigation: No additional findings.G6PD 2015 was normal CBC Latest Ref Rng & Units  07/02/2017 06/20/2017 04/23/2017  WBC 3.8 - 10.8 Thousand/uL 7.4 7.8 8.6  Hemoglobin 11.7 - 15.5 g/dL 16.111.9 09.611.7 04.511.7  Hematocrit 35.0 - 45.0 % 36.0 34.7(L) 36.3  Platelets 140 - 400 Thousand/uL 213 231 223   CMP Latest Ref Rng & Units 07/02/2017 06/20/2017 04/23/2017  Glucose 65 - 99 mg/dL 409(W236(H) 119(J179(H) 478(G203(H)  BUN 7 - 25 mg/dL 18 19 19   Creatinine 0.50 - 0.99 mg/dL 9.56(O1.18(H) 1.30(Q1.13(H) 6.57(Q1.24(H)  Sodium 135 - 146 mmol/L 139 140 138  Potassium 3.5 - 5.3 mmol/L 4.5 5.0 5.0  Chloride 98 - 110 mmol/L 102 102 103  CO2 20 - 32 mmol/L 29 29 26   Calcium 8.6 - 10.4 mg/dL 9.1 9.5 9.1  Total Protein 6.1 - 8.1 g/dL 7.0 7.1 6.8  Total Bilirubin 0.2 - 1.2 mg/dL 0.3 0.4 0.4  Alkaline Phos 33 - 130 U/L - - 97  AST 10 - 35 U/L 13 15 15   ALT 6 - 29 U/L 7 10 8     Imaging: No results found.  Speciality Comments: No specialty comments available.    Procedures:  No procedures performed Allergies: Levofloxacin and Quinolones   Assessment / Plan:     Visit Diagnoses: Rheumatoid arthritis with rheumatoid factor of multiple sites without organ or systems involvement (HCC) - +ANA . Patient has done very well on methotrexate in the past but it was discontinued due to elevation of creatinine. She was started on Arava last visit. She states she developed a rash and pruritus which s getting better offered Arava. She had no synovitis on examination.we had detailed discussion. I would like to hold off methotrexate for right now unless she develops synovitis. She is allergic to quinolones. I also discussed the possible use of sulfasalazine or Biologics in case she has recurrence of arthritis. She will notify me if she develops a flare.  High risk medication use - Arava(rash), previously on MTX dcd due to elevated Cr. I will check her BMP, TPM T and TB gold today.  Primary osteoarthritis of both knees: Chronic pain  DDD (degenerative disc disease), cervical: She is some discomfort with range of motion.  History of diabetes  mellitus  History of renal insufficiency - GFR high 40s- low 50s   History of hypertension: Blood pressure is elevated she will continue to monitor that.  History of obesity : Weight loss diet and exercise was discussed.   Orders: Orders Placed This Encounter  Procedures  . Thiopurine methyltransferase(tpmt)rbc  . BASIC METABOLIC PANEL WITH GFR  . Quantiferon tb gold assay   No orders of the defined types were placed in this encounter.   .  Follow-Up Instructions: Return in about 3 months (around 11/21/2017) for Rheumatoid arthritis.   Pollyann SavoyShaili Insiya Oshea, MD  Note - This record has been created using Animal nutritionistDragon software.  Chart creation errors have been sought, but may not always  have been located. Such creation errors do not reflect on  the standard of medical care.

## 2017-08-21 ENCOUNTER — Encounter: Payer: Self-pay | Admitting: Rheumatology

## 2017-08-21 ENCOUNTER — Ambulatory Visit: Payer: BC Managed Care – PPO | Admitting: Rheumatology

## 2017-08-21 ENCOUNTER — Encounter (INDEPENDENT_AMBULATORY_CARE_PROVIDER_SITE_OTHER): Payer: Self-pay

## 2017-08-21 VITALS — BP 173/91 | HR 79 | Ht 67.0 in | Wt 250.0 lb

## 2017-08-21 DIAGNOSIS — Z8679 Personal history of other diseases of the circulatory system: Secondary | ICD-10-CM | POA: Diagnosis not present

## 2017-08-21 DIAGNOSIS — Z9225 Personal history of immunosupression therapy: Secondary | ICD-10-CM | POA: Diagnosis not present

## 2017-08-21 DIAGNOSIS — Z87448 Personal history of other diseases of urinary system: Secondary | ICD-10-CM

## 2017-08-21 DIAGNOSIS — M0579 Rheumatoid arthritis with rheumatoid factor of multiple sites without organ or systems involvement: Secondary | ICD-10-CM

## 2017-08-21 DIAGNOSIS — Z8639 Personal history of other endocrine, nutritional and metabolic disease: Secondary | ICD-10-CM

## 2017-08-21 DIAGNOSIS — M170 Bilateral primary osteoarthritis of knee: Secondary | ICD-10-CM

## 2017-08-21 DIAGNOSIS — M5030 Other cervical disc degeneration, unspecified cervical region: Secondary | ICD-10-CM

## 2017-08-21 DIAGNOSIS — Z79899 Other long term (current) drug therapy: Secondary | ICD-10-CM

## 2017-08-22 NOTE — Progress Notes (Signed)
Labs are stable.

## 2017-09-03 LAB — BASIC METABOLIC PANEL WITH GFR
BUN/Creatinine Ratio: 14 (calc) (ref 6–22)
BUN: 17 mg/dL (ref 7–25)
CALCIUM: 9.6 mg/dL (ref 8.6–10.4)
CHLORIDE: 102 mmol/L (ref 98–110)
CO2: 29 mmol/L (ref 20–32)
Creat: 1.18 mg/dL — ABNORMAL HIGH (ref 0.50–0.99)
GFR, Est African American: 56 mL/min/{1.73_m2} — ABNORMAL LOW (ref >=60)
GFR, Est Non African American: 48 mL/min/{1.73_m2} — ABNORMAL LOW (ref >=60)
Glucose, Bld: 161 mg/dL — ABNORMAL HIGH (ref 65–99)
POTASSIUM: 4.2 mmol/L (ref 3.5–5.3)
SODIUM: 140 mmol/L (ref 135–146)

## 2017-09-03 LAB — QUANTIFERON TB GOLD ASSAY (BLOOD)
Mitogen-Nil: >10 IU/mL
QUANTIFERON NIL VALUE: 1.38 [IU]/mL
QUANTIFERON(R)-TB GOLD: NEGATIVE
Quantiferon Tb Ag Minus Nil Value: 0.02 IU/mL

## 2017-09-03 LAB — THIOPURINE METHYLTRANSFERASE (TPMT), RBC: Thiopurine Methyltransferase, RBC: <3 nmol/hr/mL RBC — ABNORMAL LOW

## 2017-09-10 ENCOUNTER — Telehealth (INDEPENDENT_AMBULATORY_CARE_PROVIDER_SITE_OTHER): Payer: Self-pay

## 2017-09-10 NOTE — Telephone Encounter (Signed)
Patient would like a call concerning lab results.  Cb# is [phone].  Please advise.  Thank you.

## 2017-09-11 NOTE — Telephone Encounter (Signed)
Patient states she wanted to know about the recent lab recent lab results. Please review. 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 Patient was questioning if she is allergic to sulfa. Please advise.

## 2017-09-11 NOTE — Telephone Encounter (Signed)
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 Does not indicate to him sulfa allergy. She had G6PD and 2015 which was normal. She should be able to use sulfasalazine in future if needed.

## 2017-09-12 NOTE — Telephone Encounter (Signed)
Attempted to contact the patient and left message for patient to call the office.  

## 2017-09-12 NOTE — Telephone Encounter (Signed)
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 Patient has a normal G6PD which does not indicate a sulfa allergy. Patient should be able to use sulfasalazine in the future if need be. Patient verbalized understanding.

## 2017-09-19 ENCOUNTER — Ambulatory Visit: Payer: BC Managed Care – PPO | Admitting: Rheumatology

## 2017-10-23 ENCOUNTER — Telehealth: Payer: Self-pay | Admitting: Sports Medicine

## 2017-10-23 NOTE — Telephone Encounter (Signed)
error 

## 2017-11-26 NOTE — Progress Notes (Signed)
Office Visit Note  Patient: Jennifer Horn             Date of Birth: 24-Apr-1951           MRN: [id number]             PCP: Gordan Payment., MD Referring: Gordan Payment., MD Visit Date: 12/10/2017 Occupation: @GUAROCC @    Subjective:  Joint stiffness   History of Present Illness: Jennifer Horn is a 67 y.o. female history of seropositive rheumatoid arthritis.  She has been off of DMARD since timbre of 2018.  She has had no recurrence of arthritis.  She continues to have some stiffness from underlying osteoarthritis.  Activities of Daily Living:  Patient reports morning stiffness for 2 minutes.   Patient Denies nocturnal pain.  Difficulty dressing/grooming: Denies Difficulty climbing stairs: Denies Difficulty getting out of chair: Reports Difficulty using hands for taps, buttons, cutlery, and/or writing: Denies   Review of Systems  Constitutional: Negative for fatigue, night sweats, weight gain, weight loss and weakness.  HENT: Negative for mouth sores, trouble swallowing, trouble swallowing, mouth dryness and nose dryness.   Eyes: Negative for pain, redness, visual disturbance and dryness.  Respiratory: Negative for cough, shortness of breath and difficulty breathing.   Cardiovascular: Negative for chest pain, palpitations, hypertension, irregular heartbeat and swelling in legs/feet.  Gastrointestinal: Negative for blood in stool, constipation and diarrhea.  Endocrine: Negative for increased urination.  Genitourinary: Negative for vaginal dryness.  Musculoskeletal: Positive for morning stiffness. Negative for arthralgias, joint pain, joint swelling, myalgias, muscle weakness, muscle tenderness and myalgias.  Skin: Negative for color change, rash, hair loss, skin tightness, ulcers and sensitivity to sunlight.  Allergic/Immunologic: Negative for susceptible to infections.  Neurological: Negative for dizziness, memory loss and night sweats.  Hematological: Negative for  swollen glands.  Psychiatric/Behavioral: Negative for depressed mood and sleep disturbance. The patient is not nervous/anxious.     PMFS History:  Patient Active Problem List   Diagnosis Date Noted  . ANA positive 06/19/2017  . DJD (degenerative joint disease), cervical 01/16/2017  . Primary osteoarthritis of both knees 01/16/2017  . Acute pain of right knee 09/26/2016  . Diabetes (HCC) 08/21/2016  . Hypertension 08/21/2016  . Rheumatoid arthritis with rheumatoid factor of multiple sites without organ or systems involvement (HCC) 08/19/2016  . High risk medication use 08/19/2016  . Varicose veins of lower extremities with other complications 03/25/2014    Past Medical History:  Diagnosis Date  . Anemia   . Diabetes mellitus without complication (HCC)   . Hypertension   . Osteoarthritis   . Rheumatoid arthritis (HCC)     Family History  Problem Relation Age of Onset  . Diabetes Mother   . Hyperlipidemia Mother   . Hypertension Mother   . Hypertension Father   . Hypertension Daughter    Past Surgical History:  Procedure Laterality Date  . MOUTH SURGERY     Social History   Social History Narrative  . Not on file     Objective: Vital Signs: BP (!) 169/84 (BP Location: Left Arm, Patient Position: Sitting, Cuff Size: Normal)   Pulse 74   Resp 17   Ht 5' 7.5" (1.715 m)   Wt 247 lb (112 kg)   BMI 38.11 kg/m    Physical Exam  Constitutional: She is oriented to person, place, and time. She appears well-developed and well-nourished.  HENT:  Head: Normocephalic and atraumatic.  Eyes: Conjunctivae and EOM are normal.  Neck: Normal range  of motion.  Cardiovascular: Normal rate, regular rhythm, normal heart sounds and intact distal pulses.  Pulmonary/Chest: Effort normal and breath sounds normal.  Abdominal: Soft. Bowel sounds are normal.  Lymphadenopathy:    She has no cervical adenopathy.  Neurological: She is alert and oriented to person, place, and time.  Skin:  Skin is warm and dry. Capillary refill takes less than 2 seconds.  Psychiatric: She has a normal mood and affect. Her behavior is normal.  Nursing note and vitals reviewed.    Musculoskeletal Exam: C-spine thoracic lumbar spine good range of motion.  Shoulder joints elbow joints wrist joint MCPs PIPs DIPs with good range of motion with no synovitis.  She has some thickening of DIP joints.  Hip joints knee joints ankles MTPs PIPs DIPs a good range of motion with no synovitis on examination. CDAI Exam: CDAI Homunculus Exam:   Joint Counts:  CDAI Tender Joint count: 0 CDAI Swollen Joint count: 0  Global Assessments:  Patient Global Assessment: 0 Provider Global Assessment: 0    Investigation: No additional findings. CBC Latest Ref Rng & Units 07/02/2017 06/20/2017 04/23/2017  WBC 3.8 - 10.8 Thousand/uL 7.4 7.8 8.6  Hemoglobin 11.7 - 15.5 g/dL 16.111.9 09.611.7 04.511.7  Hematocrit 35.0 - 45.0 % 36.0 34.7(L) 36.3  Platelets 140 - 400 Thousand/uL 213 231 223   CMP Latest Ref Rng & Units 08/21/2017 07/02/2017 06/20/2017  Glucose 65 - 99 mg/dL 409(W161(H) 119(J236(H) 478(G179(H)  BUN 7 - 25 mg/dL 17 18 19   Creatinine 0.50 - 0.99 mg/dL 9.56(O1.18(H) 1.30(Q1.18(H) 6.57(Q1.13(H)  Sodium 135 - 146 mmol/L 140 139 140  Potassium 3.5 - 5.3 mmol/L 4.2 4.5 5.0  Chloride 98 - 110 mmol/L 102 102 102  CO2 20 - 32 mmol/L 29 29 29   Calcium 8.6 - 10.4 mg/dL 9.6 9.1 9.5  Total Protein 6.1 - 8.1 g/dL - 7.0 7.1  Total Bilirubin 0.2 - 1.2 mg/dL - 0.3 0.4  Alkaline Phos 33 - 130 U/L - - -  AST 10 - 35 U/L - 13 15  ALT 6 - 29 U/L - 7 10    Imaging: No results found.  Speciality Comments: No specialty comments available.    Procedures:  No procedures performed Allergies: Levofloxacin and Quinolones   Assessment / Plan:     Visit Diagnoses: Rheumatoid arthritis with rheumatoid factor of multiple sites without organ or systems involvement (HCC) - +ANA . Patient has done very well on methotrexate in the past but it was discontinued due to  elevation of creatinine.  She is clinically doing well and appears to be in remission as regards to rheumatoid arthritis.  She continues to have some stiffness from underlying osteoarthritis.  High risk medication use -she is off all medications none.  Arava(rash), previously on MTX dcd due to elevated Cr  Primary osteoarthritis of both knees: Weight loss diet and exercise was discussed.  DDD (degenerative disc disease), cervical: Chronic pain but tolerable  History of obesity: Benefits of weight loss diet and exercise was discussed.  History of diabetes mellitus  History of hypertension  History of renal insufficiency - GFR high 40s- low 50s     Orders: No orders of the defined types were placed in this encounter.  No orders of the defined types were placed in this encounter.   Face-to-face time spent with patient was 20 minutes.  Greater than 50% of time was spent in counseling and coordination of care.  Follow-Up Instructions: Return in about 1 year (around 12/10/2018) for  Rheumatoid arthritis, Osteoarthritis.   Bo Merino, MD  Note - This record has been created using Editor, commissioning.  Chart creation errors have been sought, but may not always  have been located. Such creation errors do not reflect on  the standard of medical care.

## 2017-12-10 ENCOUNTER — Encounter: Payer: Self-pay | Admitting: Rheumatology

## 2017-12-10 ENCOUNTER — Ambulatory Visit: Payer: BC Managed Care – PPO | Admitting: Rheumatology

## 2017-12-10 VITALS — BP 169/84 | HR 74 | Resp 17 | Ht 67.5 in | Wt 247.0 lb

## 2017-12-10 DIAGNOSIS — Z79899 Other long term (current) drug therapy: Secondary | ICD-10-CM | POA: Diagnosis not present

## 2017-12-10 DIAGNOSIS — Z87448 Personal history of other diseases of urinary system: Secondary | ICD-10-CM

## 2017-12-10 DIAGNOSIS — M5030 Other cervical disc degeneration, unspecified cervical region: Secondary | ICD-10-CM

## 2017-12-10 DIAGNOSIS — M170 Bilateral primary osteoarthritis of knee: Secondary | ICD-10-CM

## 2017-12-10 DIAGNOSIS — Z8639 Personal history of other endocrine, nutritional and metabolic disease: Secondary | ICD-10-CM | POA: Diagnosis not present

## 2017-12-10 DIAGNOSIS — Z8679 Personal history of other diseases of the circulatory system: Secondary | ICD-10-CM

## 2017-12-10 DIAGNOSIS — M0579 Rheumatoid arthritis with rheumatoid factor of multiple sites without organ or systems involvement: Secondary | ICD-10-CM

## 2017-12-31 NOTE — Progress Notes (Signed)
Office Visit Note  Patient: Jennifer Horn             Date of Birth: June 09, 1951           MRN: [id number]             PCP: Gordan Payment., MD Referring: Gordan Payment., MD Visit Date: 01/01/2018 Occupation: @GUAROCC @    Subjective:  Right hand pain    History of Present Illness: Yvetta Drotar is a 67 y.o. female with history of seropositive rheumatoid arthritis, osteoarthritis, and DDD.  Patient states that she has been off of [place] since September 2018.  She states she developed a rash while on Arava.  Patient states that she woke up Sunday morning with diffuse right hand pain and swelling.  She states that her hand was swollen and stiff for several hours Sunday morning and Monday morning.  She states she woke up this morning and the swelling had improved.  She denies any swelling in any other joints.  She states she was having some right knee pain last week which has resolved.  She denies any swelling in her knee joint.  Patient states that she was cutting a lot of fruits on Friday and vacuuming on Saturday which may have led to increased hand pain since she is right handed.  Patient states she is taking Tylenol extra strength for pain relief.   Activities of Daily Living:  Patient reports morning stiffness for 0 minutes.   Patient Denies nocturnal pain.  Difficulty dressing/grooming: Denies Difficulty climbing stairs: Denies Difficulty getting out of chair: Denies Difficulty using hands for taps, buttons, cutlery, and/or writing: Denies   Review of Systems  Constitutional: Negative for fatigue and weakness.  HENT: Negative for mouth sores, trouble swallowing, trouble swallowing, mouth dryness and nose dryness.   Eyes: Negative for pain, redness, visual disturbance and dryness.  Respiratory: Negative for cough, hemoptysis, shortness of breath and difficulty breathing.   Cardiovascular: Positive for hypertension. Negative for chest pain, palpitations, irregular heartbeat  and swelling in legs/feet.  Gastrointestinal: Negative for blood in stool, constipation and diarrhea.  Endocrine: Negative for increased urination.  Genitourinary: Negative for painful urination.  Musculoskeletal: Positive for arthralgias, joint pain, joint swelling and morning stiffness. Negative for myalgias, muscle weakness, muscle tenderness and myalgias.  Skin: Positive for rash (Eczema). Negative for color change, pallor, hair loss, nodules/bumps, redness, skin tightness, ulcers and sensitivity to sunlight.  Neurological: Negative for dizziness, numbness and headaches.  Hematological: Negative for swollen glands.  Psychiatric/Behavioral: Negative for depressed mood and sleep disturbance. The patient is not nervous/anxious.     PMFS History:  Patient Active Problem List   Diagnosis Date Noted  . ANA positive 06/19/2017  . DJD (degenerative joint disease), cervical 01/16/2017  . Primary osteoarthritis of both knees 01/16/2017  . Acute pain of right knee 09/26/2016  . Diabetes (HCC) 08/21/2016  . Hypertension 08/21/2016  . Rheumatoid arthritis with rheumatoid factor of multiple sites without organ or systems involvement (HCC) 08/19/2016  . High risk medication use 08/19/2016  . Varicose veins of bilateral lower extremities with other complications 03/25/2014    Past Medical History:  Diagnosis Date  . Anemia   . Diabetes mellitus without complication (HCC)   . Hypertension   . Osteoarthritis   . Rheumatoid arthritis (HCC)     Family History  Problem Relation Age of Onset  . Diabetes Mother   . Hyperlipidemia Mother   . Hypertension Mother   . Hypertension Father   .  Hypertension Daughter    Past Surgical History:  Procedure Laterality Date  . MOUTH SURGERY     Social History   Social History Narrative  . Not on file     Objective: Vital Signs: BP (!) 169/81 (BP Location: Left Arm, Patient Position: Sitting, Cuff Size: Normal)   Pulse 82   Resp 17   Ht 5' 7.5"  (1.715 m)   Wt 248 lb (112.5 kg)   BMI 38.27 kg/m    Physical Exam  Constitutional: She is oriented to person, place, and time. She appears well-developed and well-nourished.  HENT:  Head: Normocephalic and atraumatic.  Eyes: Conjunctivae and EOM are normal.  Neck: Normal range of motion.  Cardiovascular: Normal rate, regular rhythm, normal heart sounds and intact distal pulses.  Pulmonary/Chest: Effort normal and breath sounds normal.  Abdominal: Soft. Bowel sounds are normal.  Lymphadenopathy:    She has no cervical adenopathy.  Neurological: She is alert and oriented to person, place, and time.  Skin: Skin is warm and dry. Capillary refill takes less than 2 seconds.  Psychiatric: She has a normal mood and affect. Her behavior is normal.  Nursing note and vitals reviewed.    Musculoskeletal Exam: C-spine, thoracic, and lumbar good ROM.  No midline spinal tenderness.  No SI joint tenderness. Shoulder joints, elbow joints, wrist joints, MCPs, PIPs, and DIPs good ROM with no synovitis. DIP synovial thickening consistent with osteoarthritis.  Right CMC joint tenderness.  Hip joints, knee joints, ankle joints, MTPs, PIPs, and DIPs good ROM with no synovitis.  No warmth or effusion of knees.  No knee crepitus.  No trochanteric bursa tenderness.   CDAI Exam: CDAI Homunculus Exam:   Joint Counts:  CDAI Tender Joint count: 0 CDAI Swollen Joint count: 0  Global Assessments:  Patient Global Assessment: 5 Provider Global Assessment: 2  CDAI Calculated Score: 7    Investigation: No additional findings. CBC Latest Ref Rng & Units 07/02/2017 06/20/2017 04/23/2017  WBC 3.8 - 10.8 Thousand/uL 7.4 7.8 8.6  Hemoglobin 11.7 - 15.5 g/dL 14.711.9 82.911.7 56.211.7  Hematocrit 35.0 - 45.0 % 36.0 34.7(L) 36.3  Platelets 140 - 400 Thousand/uL 213 231 223   CMP Latest Ref Rng & Units 08/21/2017 07/02/2017 06/20/2017  Glucose 65 - 99 mg/dL 130(Q161(H) 657(Q236(H) 469(G179(H)  BUN 7 - 25 mg/dL 17 18 19   Creatinine 0.50 - 0.99  mg/dL 2.95(M1.18(H) 8.41(L1.18(H) 2.44(W1.13(H)  Sodium 135 - 146 mmol/L 140 139 140  Potassium 3.5 - 5.3 mmol/L 4.2 4.5 5.0  Chloride 98 - 110 mmol/L 102 102 102  CO2 20 - 32 mmol/L 29 29 29   Calcium 8.6 - 10.4 mg/dL 9.6 9.1 9.5  Total Protein 6.1 - 8.1 g/dL - 7.0 7.1  Total Bilirubin 0.2 - 1.2 mg/dL - 0.3 0.4  Alkaline Phos 33 - 130 U/L - - -  AST 10 - 35 U/L - 13 15  ALT 6 - 29 U/L - 7 10    Imaging: No results found.  Speciality Comments: No specialty comments available.    Procedures:  No procedures performed Allergies: Levofloxacin and Quinolones   Assessment / Plan:     Visit Diagnoses: Rheumatoid arthritis with rheumatoid factor of multiple sites without organ or systems involvement [facility](HCC): She has no synovitis on exam.  She has some tenderness of her right CMC joint.  She has DIP synovial thickening consistent with osteoarthritis.  She was advised to take pictures of her hands if they continue to swell.  She is also  encouraged to keep a log of episodes when her hands are painful and swollen.  We discussed trying a right CMC brace.  We also discussed a cortisone injection in the future if she continues to have significant pain in her right CMC joint.  If she does develop a flare of rheumatoid arthritis we discussed starting Plaquenil or restarting methotrexate.  Primary osteoarthritis of both knees: No warmth or effusion on exam.  No bilateral knee crepitus.  She has some discomfort in her right knee several days ago which has resolved.  High risk medication use -She discontinued Arava in September due to developing a rash. MTX was discontinued due to elevated creatinine.   DDD (degenerative disc disease), cervical: Chronic discomfort.  Other medical conditions are listed as follows:  Essential hypertension  Varicose veins of bilateral lower extremities with other complications  History of diabetes mellitus, type II    Orders: No orders of the defined types were placed in this  encounter.  No orders of the defined types were placed in this encounter.    Follow-Up Instructions: Return for Rheumatoid arthritis, Osteoarthritis.   Gearldine Bienenstock, PA-C   I examined and evaluated the patient with Sherron Ales PA. The plan of care was discussed as noted above.  Pollyann Savoy, MD  Note - This record has been created using Animal nutritionist.  Chart creation errors have been sought, but may not always  have been located. Such creation errors do not reflect on  the standard of medical care.

## 2018-01-01 ENCOUNTER — Encounter: Payer: Self-pay | Admitting: Physician Assistant

## 2018-01-01 ENCOUNTER — Ambulatory Visit: Payer: BC Managed Care – PPO | Admitting: Physician Assistant

## 2018-01-01 VITALS — BP 169/81 | HR 82 | Resp 17 | Ht 67.5 in | Wt 248.0 lb

## 2018-01-01 DIAGNOSIS — M0579 Rheumatoid arthritis with rheumatoid factor of multiple sites without organ or systems involvement: Secondary | ICD-10-CM

## 2018-01-01 DIAGNOSIS — R768 Other specified abnormal immunological findings in serum: Secondary | ICD-10-CM | POA: Diagnosis not present

## 2018-01-01 DIAGNOSIS — M170 Bilateral primary osteoarthritis of knee: Secondary | ICD-10-CM | POA: Diagnosis not present

## 2018-01-01 DIAGNOSIS — Z8639 Personal history of other endocrine, nutritional and metabolic disease: Secondary | ICD-10-CM

## 2018-01-01 DIAGNOSIS — Z79899 Other long term (current) drug therapy: Secondary | ICD-10-CM

## 2018-01-01 DIAGNOSIS — I10 Essential (primary) hypertension: Secondary | ICD-10-CM

## 2018-01-01 DIAGNOSIS — M5030 Other cervical disc degeneration, unspecified cervical region: Secondary | ICD-10-CM

## 2018-01-01 DIAGNOSIS — I83893 Varicose veins of bilateral lower extremities with other complications: Secondary | ICD-10-CM

## 2018-01-11 ENCOUNTER — Ambulatory Visit (INDEPENDENT_AMBULATORY_CARE_PROVIDER_SITE_OTHER): Payer: BC Managed Care – PPO | Admitting: Orthopaedic Surgery

## 2018-01-11 ENCOUNTER — Encounter (INDEPENDENT_AMBULATORY_CARE_PROVIDER_SITE_OTHER): Payer: Self-pay | Admitting: Orthopaedic Surgery

## 2018-01-11 ENCOUNTER — Ambulatory Visit (INDEPENDENT_AMBULATORY_CARE_PROVIDER_SITE_OTHER): Payer: BC Managed Care – PPO

## 2018-01-11 VITALS — Ht 67.0 in | Wt 248.0 lb

## 2018-01-11 DIAGNOSIS — M1711 Unilateral primary osteoarthritis, right knee: Secondary | ICD-10-CM | POA: Diagnosis not present

## 2018-01-11 DIAGNOSIS — M2550 Pain in unspecified joint: Secondary | ICD-10-CM

## 2018-01-11 DIAGNOSIS — M25561 Pain in right knee: Secondary | ICD-10-CM | POA: Diagnosis not present

## 2018-01-11 NOTE — Progress Notes (Signed)
Office Visit Note   Patient: Jennifer Horn           Date of Birth: October 11, 1951           MRN: [id number] Visit Date: 01/11/2018              Requested by: Gordan Payment., MD [address] [place], [place] [address] PCP: Gordan Payment., MD   Assessment & Plan: Visit Diagnoses:  1. Acute pain of right knee   2. Unilateral primary osteoarthritis, right knee   3. Polyarthralgia     Plan: Advised patient that the right knee pain that she has been experiencing is related to her rheumatoid arthritis.  She has also been having more polyarthralgias since coming off Arava September 2018.  I think that she needs to follow with Dr. Corliss Skains to discuss other medication options.    Follow-Up Instructions: Return in about 3 months (around 04/13/2018) for Dr Ophelia Charter.   Orders:  Orders Placed This Encounter  Procedures  . XR Knee 1-2 Views Right   No orders of the defined types were placed in this encounter.     Procedures: No procedures performed   Clinical Data: No additional findings.   Subjective: Chief Complaint  Patient presents with  . Right Knee - Pain    HPI 67 year old female with past medical history significant for rheumatoid arthritis comes in with complaints of right knee pain and polyarthralgias.  Patient followed by Dr. Corliss Skains and was last seen by her PA January 01, 2018.  Patient had been on Arava for her rheumatoid arthritis and discontinued this medication September 2018.  She is noticed more flareups of her polyarthralgias since that time.  Patient states that she had been advised that the medication also may have contributed to her pulmonary nodule.  It is not clear as to whether or not she has discussed this with her pulmonologist. Review of Systems No current cardiac pulmonary GI GU issues  Objective: Vital Signs: Ht 5\' 7"  (1.702 m)   Wt 248 lb (112.5 kg)   BMI 38.84 kg/m   Physical Exam  Constitutional: She is oriented to person, place, and time.  No distress.  HENT:  Head: Normocephalic.  Eyes: Pupils are equal, round, and reactive to light.  Pulmonary/Chest: No respiratory distress.  Musculoskeletal:  Gait is somewhat antalgic.  Right knee good range of motion.  Some swelling with small effusion.  Joint nontender.  Calf nontender.  Neuro vas intact.  Negative logroll bilateral hips.  Negative straight leg raise.  Neurological: She is alert and oriented to person, place, and time.  Skin: Skin is warm and dry.    Ortho Exam  Specialty Comments:  No specialty comments available.  Imaging: No results found.   PMFS History: Patient Active Problem List   Diagnosis Date Noted  . ANA positive 06/19/2017  . DJD (degenerative joint disease), cervical 01/16/2017  . Primary osteoarthritis of both knees 01/16/2017  . Acute pain of right knee 09/26/2016  . Diabetes (HCC) 08/21/2016  . Hypertension 08/21/2016  . Rheumatoid arthritis with rheumatoid factor of multiple sites without organ or systems involvement (HCC) 08/19/2016  . High risk medication use 08/19/2016  . Varicose veins of bilateral lower extremities with other complications 03/25/2014   Past Medical History:  Diagnosis Date  . Anemia   . Diabetes mellitus without complication (HCC)   . Hypertension   . Osteoarthritis   . Rheumatoid arthritis (HCC)     Family History  Problem  Relation Age of Onset  . Diabetes Mother   . Hyperlipidemia Mother   . Hypertension Mother   . Hypertension Father   . Hypertension Daughter     Past Surgical History:  Procedure Laterality Date  . MOUTH SURGERY     Social History   Occupational History  . Not on file  Tobacco Use  . Smoking status: Never Smoker  . Smokeless tobacco: Never Used  Substance and Sexual Activity  . Alcohol use: Yes    Comment: Occassionally/rare  . Drug use: No  . Sexual activity: Not on file

## 2018-01-21 IMAGING — CT CT CHEST W/O CM
2 of 4 series · 15 of 36 positions shown, 18 images · non-contrast
Comparison: Chest x-ray 03/05/2017

CLINICAL DATA: Evaluate possible pulmonary nodule seen on recent
chest x-ray.

EXAM:
CT CHEST WITHOUT CONTRAST
TECHNIQUE: Multidetector CT imaging of the chest was performed following the
standard protocol without IV contrast.

[Series 2: chest w/o st · axial · non-contrast · 0.75mm/px · z∈[-242,+14]mm · 12 of 150 slices shown, 15 images]
[im 11/150  mediastinal]
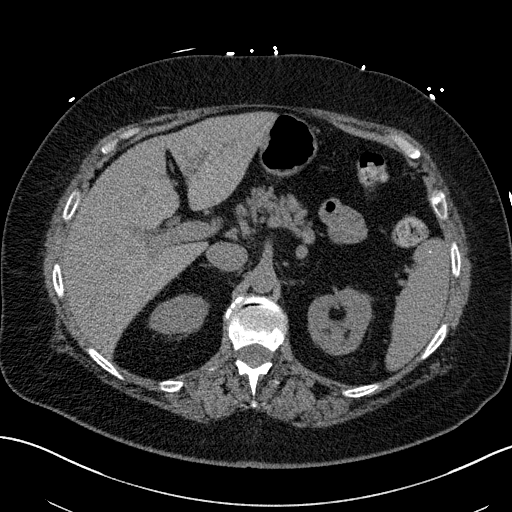
[im 11/150  lung]
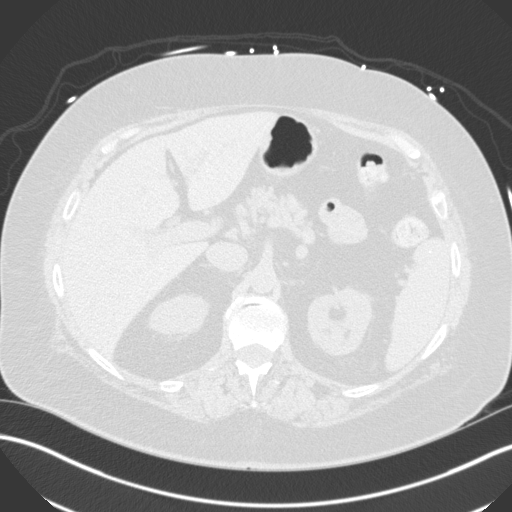
[im 22/150  lung]
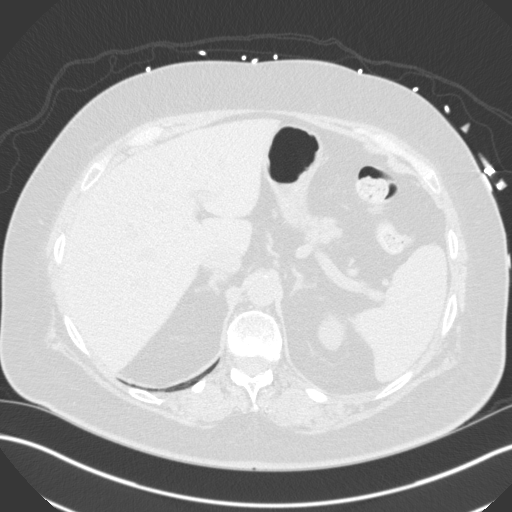
[im 32/150  lung]
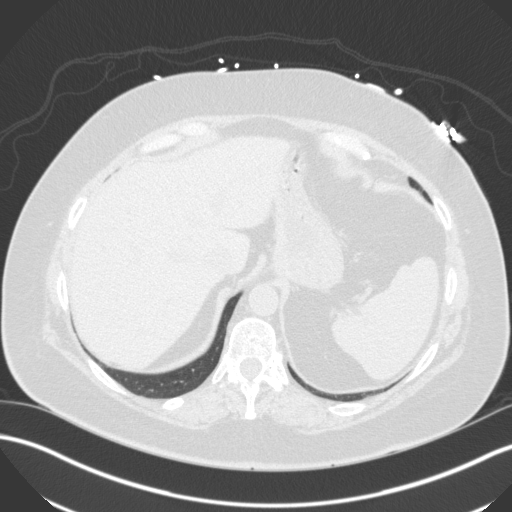
[im 43/150  lung]
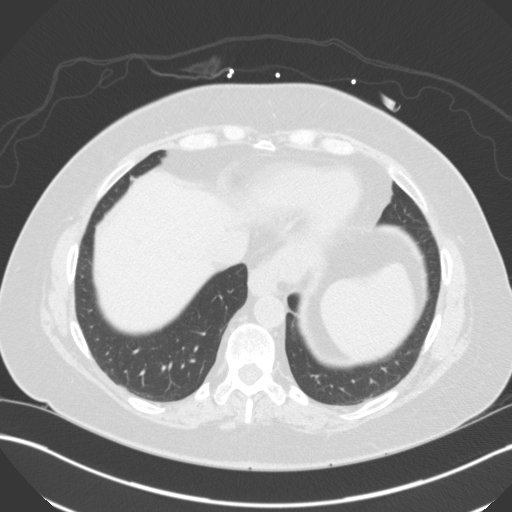
[im 54/150  mediastinal]
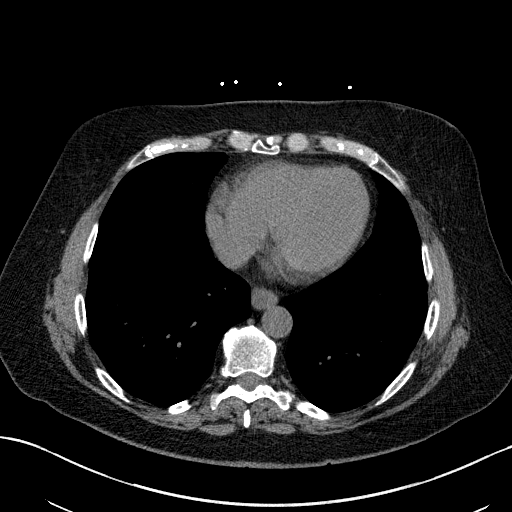
[im 54/150  lung]
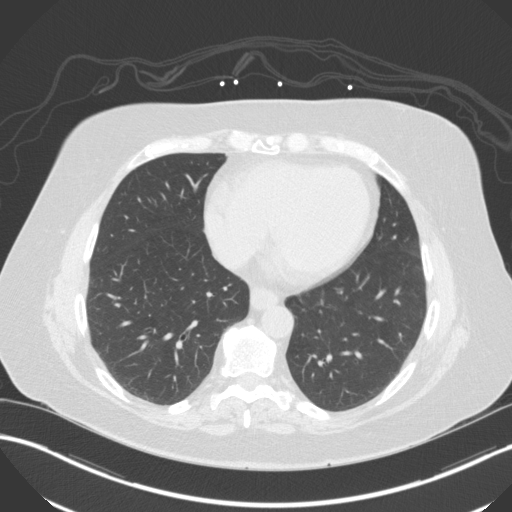
[im 64/150  lung]
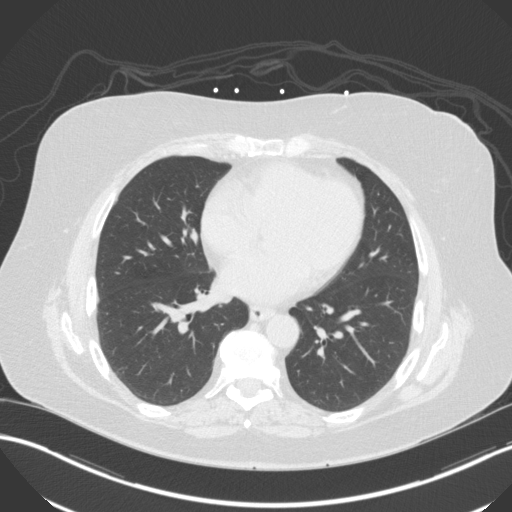
[im 86/150  lung]
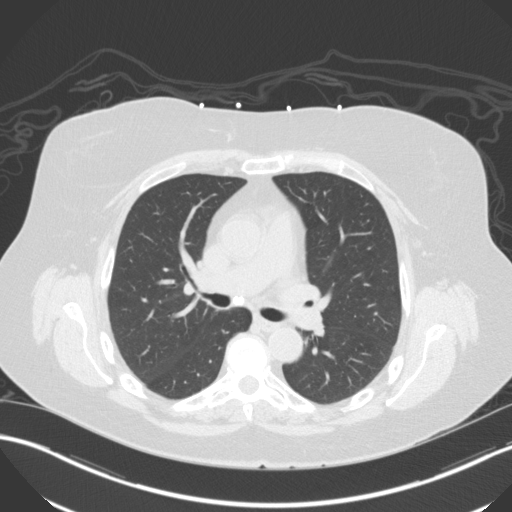
[im 96/150  lung]
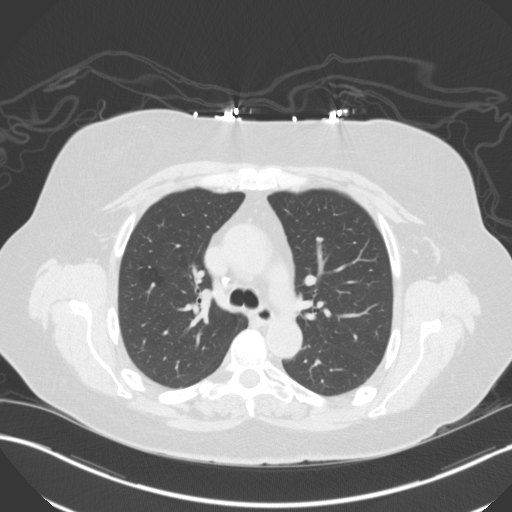
[im 107/150  mediastinal]
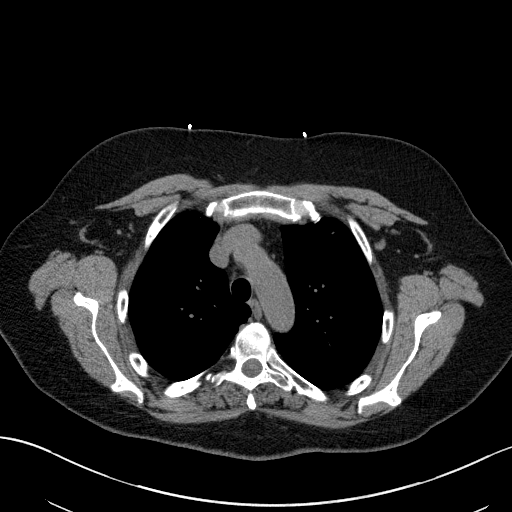
[im 107/150  lung]
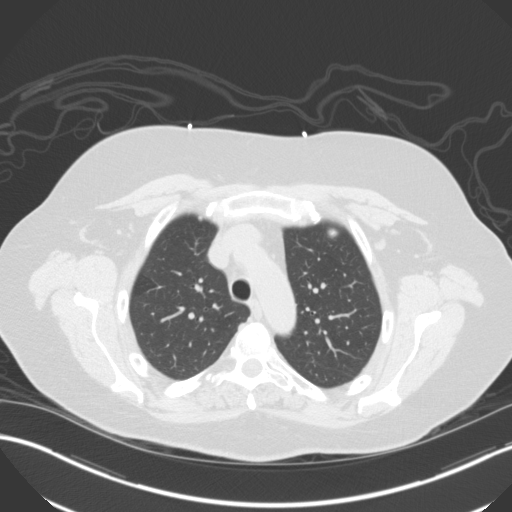
[im 118/150  lung]
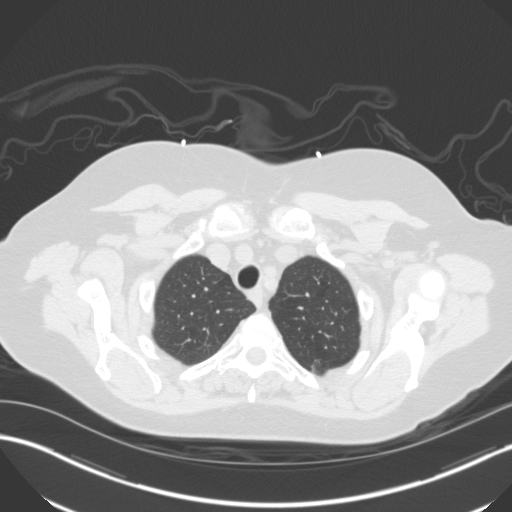
[im 128/150  lung]
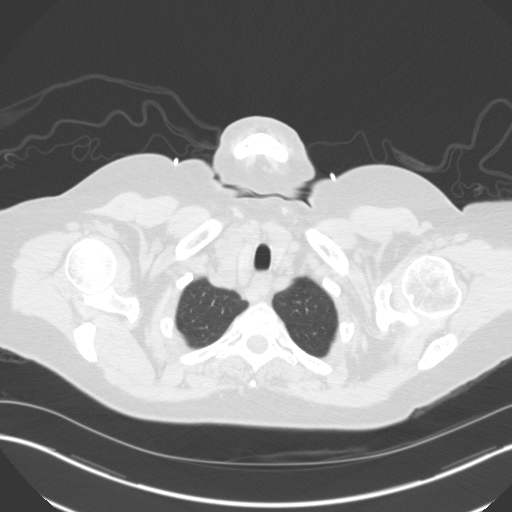
[im 139/150  lung]
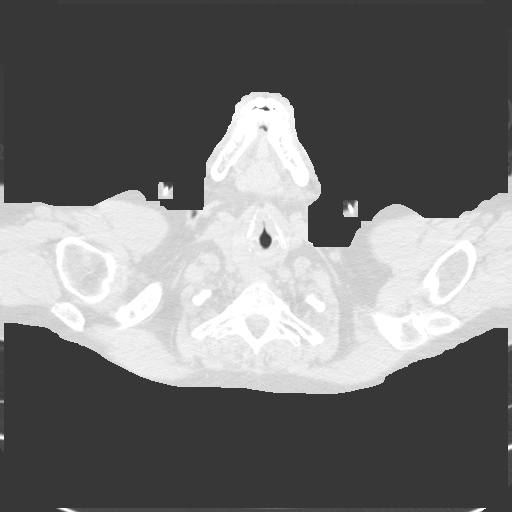

[Series 6: coronals · coronal · 0.59mm/px · 3 of 151 slices shown]
[im 31/151  lung]
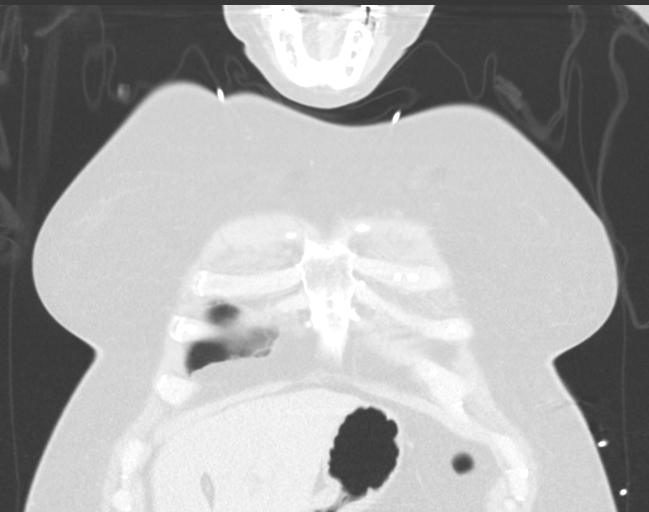
[im 61/151  lung]
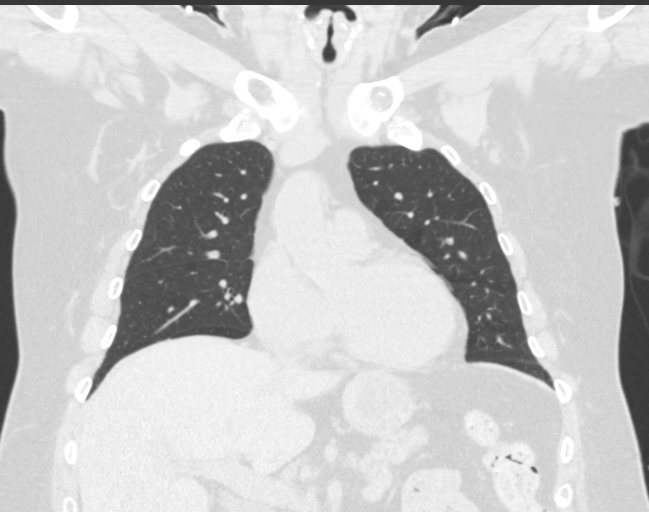
[im 91/151  lung]
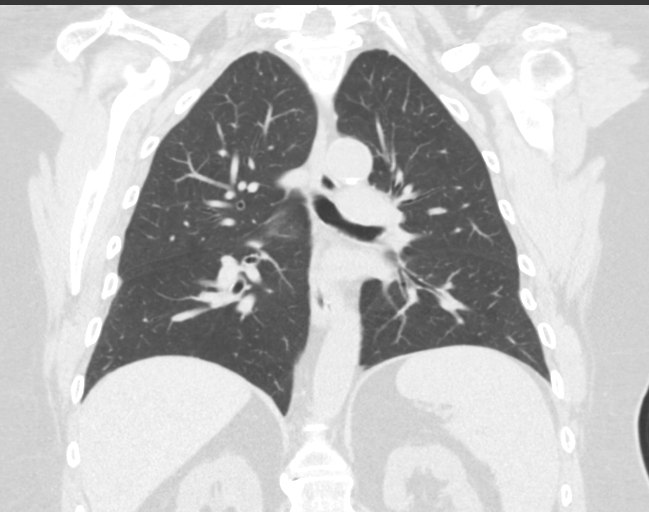

[15 of 36 positions shown; findings below may reference images not displayed]

FINDINGS: Cardiovascular: The heart is normal in size. No pericardial
effusion. The aorta is normal in caliber. Minimal scattered
atherosclerotic calcifications. Small scattered coronary artery
calcifications are noted.

Mediastinum/Nodes: Calcified mediastinal and hilar lymph nodes but
no mass or lymphadenopathy. The esophagus is grossly normal.

Lungs/Pleura: 11.5 mm slightly lobulated pulmonary nodule in the
right lower lobe is noted on image number 82. This corresponds to
the nodule seen on the chest x-ray. There is also a calcified
granuloma in the right middle on image number 93. No other pulmonary
lesions are identified.

No acute pulmonary findings. Minimal patchy emphysematous changes.
No bronchiectasis or interstitial lung disease.

Upper Abdomen: Small calcified granulomas are noted in the spleen.
No adrenal or hepatic lesions. No upper abdominal lymphadenopathy.
Small hiatal hernia.

Chest wall/ Musculoskeletal: No breast masses, supraclavicular or
axillary lymphadenopathy. Small scattered lymph nodes noted. Right
thyroid goiter.

The bony thorax is intact.
IMPRESSION: 1. 11.5 mm right lower lobe pulmonary nodule. This could be a
noncalcified granuloma given the fact but there are calcified
granulomas and calcified mediastinal and hilar lymph nodes. If the
patient has any prior studies that might show this to be a stable
finding, it would be helpful. Otherwise, given its size and the
appearance of mild emphysema, patient may need the PET-CT to exclude
neoplasm.
2. Calcified mediastinal and hilar lymph nodes but mass or
adenopathy.
3. Right thyroid goiter.

## 2018-01-28 NOTE — Progress Notes (Signed)
Office Visit Note  Patient: Jennifer Horn             Date of Birth: 02-Oct-1951           MRN: [id number]             PCP: Gordan Payment., MD Referring: Gordan Payment., MD Visit Date: 01/29/2018 Occupation: @GUAROCC @    Subjective:  Right wrist pain    History of Present Illness: Jennifer Horn is a 67 y.o. female with history of seropositive rheumatoid arthritis and osteoarthritis.  Patient states that she has been having increased pain in her right wrist.  She states that she is having limited range of motion.  She states her pain has been worsening over the past 3 days.  She denies any injuries.  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  States that she is also noticed swelling in her right wrist.  She states she has been having increased pain with activities.  She denies any numbness or tingling in her right hand.  She denies using ice.  She has been taking extra strength Tylenol for pain relief.  She states that she feels like she is getting more generalized joint pain.  She states that her neck continues to be stiff.  She states she uses heat therapy occasionally.  She states that in March she had a right knee cortisone injection which has relieved some of her knee discomfort.     Activities of Daily Living:  Patient reports morning stiffness for 3 hours.   Patient Reports nocturnal pain.  Difficulty dressing/grooming: Reports Difficulty climbing stairs: Reports Difficulty getting out of chair: Denies Difficulty using hands for taps, buttons, cutlery, and/or writing: Reports   Review of Systems  Constitutional: Negative for fatigue.  HENT: Negative for mouth sores, mouth dryness and nose dryness.   Eyes: Negative for pain, visual disturbance and dryness.  Respiratory: Negative for cough, hemoptysis, shortness of breath and difficulty breathing.   Cardiovascular: Positive  for swelling in legs/feet. Negative for chest pain, palpitations and hypertension.  Gastrointestinal: Negative for blood in stool, constipation and diarrhea.  Endocrine: Negative for excessive thirst and increased urination.  Genitourinary: Negative for difficulty urinating and painful urination.  Musculoskeletal: Positive for arthralgias, joint pain, joint swelling, morning stiffness and muscle tenderness. Negative for myalgias, muscle weakness and myalgias.  Skin: Negative for color change, pallor, rash, hair loss, nodules/bumps, skin tightness, ulcers and sensitivity to sunlight.  Allergic/Immunologic: Negative for susceptible to infections.  Neurological: Negative for dizziness, numbness and headaches.  Hematological: Negative for bruising/bleeding tendency and swollen glands.  Psychiatric/Behavioral: Positive for sleep disturbance. Negative for depressed mood. The patient is not nervous/anxious.     PMFS History:  Patient Active Problem List   Diagnosis Date Noted  . ANA positive 06/19/2017  . DJD (degenerative joint disease), cervical 01/16/2017  . Primary osteoarthritis of both knees 01/16/2017  . Acute pain of right knee 09/26/2016  . Diabetes (HCC) 08/21/2016  . Hypertension 08/21/2016  . Rheumatoid arthritis with rheumatoid factor of multiple sites without organ or systems involvement (HCC) 08/19/2016  . High risk medication use 08/19/2016  . Varicose veins of bilateral lower extremities with other complications 03/25/2014    Past Medical History:  Diagnosis Date  . Anemia   . Diabetes mellitus without complication (HCC)   . Hypertension   . Osteoarthritis   . Rheumatoid arthritis (HCC)  Family History  Problem Relation Age of Onset  . Diabetes Mother   . Hyperlipidemia Mother   . Hypertension Mother   . Hypertension Father   . Hypertension Daughter    Past Surgical History:  Procedure Laterality Date  . MOUTH SURGERY     Social History   Social History  Narrative  . Not on file     Objective: Vital Signs: BP (!) 160/75 (BP Location: Left Arm, Patient Position: Sitting, Cuff Size: Normal)   Pulse 86   Resp 16   Ht 5' 7.5" (1.715 m)   Wt 246 lb (111.6 kg)   BMI 37.96 kg/m    Physical Exam  Constitutional: She is oriented to person, place, and time. She appears well-developed and well-nourished.  HENT:  Head: Normocephalic and atraumatic.  Eyes: Conjunctivae and EOM are normal.  Neck: Normal range of motion.  Cardiovascular: Normal rate, regular rhythm, normal heart sounds and intact distal pulses.  Pulmonary/Chest: Effort normal and breath sounds normal.  Abdominal: Soft. Bowel sounds are normal.  Lymphadenopathy:    She has no cervical adenopathy.  Neurological: She is alert and oriented to person, place, and time.  Skin: Skin is warm and dry. Capillary refill takes less than 2 seconds.  Psychiatric: She has a normal mood and affect. Her behavior is normal.  Nursing note and vitals reviewed.    Musculoskeletal Exam: C-spine limited range of motion with lateral rotation especially to the left.  Thoracic and lumbar spine good range of motion.  No midline spinal tenderness.  No SI joint tenderness.  Shoulder joints, elbow joints, wrist joints, MCPs, PIPs, DIPs good range of motion with no synovitis.  She has PIP and DIP synovial thickening consistent with osteoporosis.  She has no tenderness of MCP joints.  She has tenderness of her right CMC joint.  Hip joints, knee joints, ankle joints, MTPs, PIPs, DIPs good range of motion with no synovitis.  She has good range of motion of bilateral knees with no warmth or effusion.  She has crepitus of her right knee.  No tenderness of trochanteric bursa.  She has bilateral pedal edema.  CDAI Exam: CDAI Homunculus Exam:   Tenderness:  RUE: wrist  Joint Counts:  CDAI Tender Joint count: 1 CDAI Swollen Joint count: 0  Global Assessments:  Patient Global Assessment: 6   CDAI Calculated  Score: 7    Investigation: No additional findings. CBC Latest Ref Rng & Units 07/02/2017 06/20/2017 04/23/2017  WBC 3.8 - 10.8 Thousand/uL 7.4 7.8 8.6  Hemoglobin 11.7 - 15.5 g/dL 16.111.9 09.611.7 04.511.7  Hematocrit 35.0 - 45.0 % 36.0 34.7(L) 36.3  Platelets 140 - 400 Thousand/uL 213 231 223   CMP Latest Ref Rng & Units 08/21/2017 07/02/2017 06/20/2017  Glucose 65 - 99 mg/dL 409(W161(H) 119(J236(H) 478(G179(H)  BUN 7 - 25 mg/dL 17 18 19   Creatinine 0.50 - 0.99 mg/dL 9.56(O1.18(H) 1.30(Q1.18(H) 6.57(Q1.13(H)  Sodium 135 - 146 mmol/L 140 139 140  Potassium 3.5 - 5.3 mmol/L 4.2 4.5 5.0  Chloride 98 - 110 mmol/L 102 102 102  CO2 20 - 32 mmol/L 29 29 29   Calcium 8.6 - 10.4 mg/dL 9.6 9.1 9.5  Total Protein 6.1 - 8.1 g/dL - 7.0 7.1  Total Bilirubin 0.2 - 1.2 mg/dL - 0.3 0.4  Alkaline Phos 33 - 130 U/L - - -  AST 10 - 35 U/L - 13 15  ALT 6 - 29 U/L - 7 10    Imaging: No results found.  Speciality Comments:  No specialty comments available.    Procedures:  Small Joint Inj: R thumb CMC on 01/29/2018 4:20 PM Indications: pain and joint swelling Details: 27 G needle, ultrasound-guided dorsal approach Medications: 0.5 mL lidocaine 1 %; 10 mg triamcinolone acetonide 40 MG/ML Aspirate: 0 mL Outcome: tolerated well, no immediate complications Procedure, treatment alternatives, risks and benefits explained, specific risks discussed. Immediately prior to procedure a time out was called to verify the correct patient, procedure, equipment, support staff and site/side marked as required. Patient was prepped and draped in the usual sterile fashion.     Allergies: Levofloxacin and Quinolones   Assessment / Plan:     Visit Diagnoses: Rheumatoid arthritis with rheumatoid factor of multiple sites without organ or systems involvement [facility]): She has no synovitis on exam.  She is not on any treatment for rheumatoid arthritis.  She has not had any recent rheumatoid arthritis flares.  She developed a rash after starting Arava and discontinued use in  September.    Primary osteoarthritis of both knees: No warmth or effusion of knee joints. She has good ROM on exam. She has right knee crepitus.  She had a right knee cortisone injection in March 2019, which has provided some relief.     Primary osteoarthritis of both hands: She has PIP and DIP synovial thickening consistent with ulcer arthritis.  She has bilateral CMC joint synovial thickening.  She has tenderness of her right CMC joint.  She has been having significant discomfort with activities and has been noticing swelling.  She was given a prescription for a right CMC joint brace.  She is also given a prescription for Voltaren gel which she can apply topically.  We discussed performing an ultrasound-guided cortisone injection of her right CMC joint.  She would like to move forward with a cortisone injection today in the office.  She tolerated the procedure well.  She was advised to monitor her blood pressure closely following the cortisone injection.  She was given a handout of hand exercises that she can perform at home.  She was advised to rest and ice her [facility] joint intermittently for 2-3 days.  She can continue to take extra strength Tylenol for pain relief.  High risk medication use - She discontinued Arava in September due to developing a rash. MTX was discontinued due to elevated creatinine.   DDD (degenerative disc disease), cervical: She has limited range of motion with lateral rotation especially to the left.  She has no discomfort at this time.  Other medical conditions are listed as follows:  Essential hypertension: She was advised to monitor her blood pressure closely following the cortisone injection today.  Varicose veins of bilateral lower extremities with other complications  History of diabetes mellitus, type II: She was advised to monitor blood glucose closely following the cortisone injection today.   Orders: Orders Placed This Encounter  Procedures  . Small Joint Inj     Meds ordered this encounter  Medications  . diclofenac sodium (VOLTAREN) 1 % GEL    Sig: Apply 3 g to 3 large joints up to 3 times a day    Dispense:  3 Tube    Refill:  3    Face-to-face time spent with patient was 30 minutes. >50% of time was spent in counseling and coordination of care.  Follow-Up Instructions: Return in about 5 months (around 07/01/2018) for Rheumatoid arthritis, Osteoarthritis.   Gearldine Bienenstock, PA-C   I examined and evaluated the patient with Sherron Ales PA.  The plan of care was discussed as noted above.  Bo Merino, MD  Note - This record has been created using Editor, commissioning.  Chart creation errors have been sought, but may not always  have been located. Such creation errors do not reflect on  the standard of medical care.

## 2018-01-29 ENCOUNTER — Encounter: Payer: Self-pay | Admitting: Physician Assistant

## 2018-01-29 ENCOUNTER — Ambulatory Visit (INDEPENDENT_AMBULATORY_CARE_PROVIDER_SITE_OTHER): Payer: BC Managed Care – PPO | Admitting: Rheumatology

## 2018-01-29 VITALS — BP 160/75 | HR 86 | Resp 16 | Ht 67.5 in | Wt 246.0 lb

## 2018-01-29 DIAGNOSIS — I10 Essential (primary) hypertension: Secondary | ICD-10-CM | POA: Diagnosis not present

## 2018-01-29 DIAGNOSIS — M5030 Other cervical disc degeneration, unspecified cervical region: Secondary | ICD-10-CM

## 2018-01-29 DIAGNOSIS — M19041 Primary osteoarthritis, right hand: Secondary | ICD-10-CM

## 2018-01-29 DIAGNOSIS — M189 Osteoarthritis of first carpometacarpal joint, unspecified: Secondary | ICD-10-CM

## 2018-01-29 DIAGNOSIS — Z79899 Other long term (current) drug therapy: Secondary | ICD-10-CM

## 2018-01-29 DIAGNOSIS — M170 Bilateral primary osteoarthritis of knee: Secondary | ICD-10-CM | POA: Diagnosis not present

## 2018-01-29 DIAGNOSIS — M0579 Rheumatoid arthritis with rheumatoid factor of multiple sites without organ or systems involvement: Secondary | ICD-10-CM

## 2018-01-29 DIAGNOSIS — Z8639 Personal history of other endocrine, nutritional and metabolic disease: Secondary | ICD-10-CM

## 2018-01-29 DIAGNOSIS — I83893 Varicose veins of bilateral lower extremities with other complications: Secondary | ICD-10-CM

## 2018-01-29 DIAGNOSIS — M19042 Primary osteoarthritis, left hand: Secondary | ICD-10-CM

## 2018-01-29 MED ORDER — TRIAMCINOLONE ACETONIDE 40 MG/ML IJ SUSP
10.0000 mg | INTRAMUSCULAR | Status: AC | PRN
  Administered 2018-01-29: 10 mg via INTRA_ARTICULAR

## 2018-01-29 MED ORDER — LIDOCAINE HCL 1 % IJ SOLN
0.5000 mL | INTRAMUSCULAR | Status: AC | PRN
  Administered 2018-01-29: .5 mL

## 2018-01-29 MED ORDER — DICLOFENAC SODIUM 1 % TD GEL: TRANSDERMAL | 3 refills | Status: DC

## 2018-01-29 NOTE — Patient Instructions (Signed)

## 2018-01-30 ENCOUNTER — Telehealth: Payer: Self-pay | Admitting: Rheumatology

## 2018-01-30 NOTE — Telephone Encounter (Signed)
Patient called stating she received a prescription for Diclofenac Sodium from Dr. Corliss Skainseveshwar at her appointment yesterday to take to the pharmacy.  Patient states that [facility] told her they were not able to fill it because it needs prior authorization.

## 2018-01-30 NOTE — Telephone Encounter (Signed)
A prior authorization for Diclofenac Gel has been submitted to pts insurance via cover my meds. Will update once we have a response.   Called pt to update. Patient voices understanding and denies any questions at this time.   Avaley Coop, [place], CPhT 2:08 PM

## 2018-01-31 NOTE — Telephone Encounter (Signed)
[facility] prior authorization approval for DICLOFENAC 1% GEL from 01/30/2018 to 01/30/2021.   Reference [phone]:[id number] Phone number:[phone]  Will send document to scan center.  Called pt to update. Left message.  Dejae Bernet, [place], CPhT 3:00 PM

## 2018-04-09 ENCOUNTER — Ambulatory Visit (INDEPENDENT_AMBULATORY_CARE_PROVIDER_SITE_OTHER): Payer: BC Managed Care – PPO | Admitting: Orthopaedic Surgery

## 2018-04-10 ENCOUNTER — Ambulatory Visit (INDEPENDENT_AMBULATORY_CARE_PROVIDER_SITE_OTHER): Payer: BC Managed Care – PPO | Admitting: Surgery

## 2018-06-18 NOTE — Progress Notes (Deleted)
Office Visit Note  Patient: Jennifer Horn             Date of Birth: Dec 21, 1950           MRN: [id number]             PCP: Gordan Payment., MD Referring: Gordan Payment., MD Visit Date: 07/01/2018 Occupation: @GUAROCC @  Subjective:  No chief complaint on file.   History of Present Illness: Jennifer Horn is a 67 y.o. female ***   Activities of Daily Living:  Patient reports morning stiffness for *** {minute/hour:19697}.   Patient {ACTIONS;DENIES/REPORTS:21021675::"Denies"} nocturnal pain.  Difficulty dressing/grooming: {ACTIONS;DENIES/REPORTS:21021675::"Denies"} Difficulty climbing stairs: {ACTIONS;DENIES/REPORTS:21021675::"Denies"} Difficulty getting out of chair: {ACTIONS;DENIES/REPORTS:21021675::"Denies"} Difficulty using hands for taps, buttons, cutlery, and/or writing: {ACTIONS;DENIES/REPORTS:21021675::"Denies"}  No Rheumatology ROS completed.   PMFS History:  Patient Active Problem List   Diagnosis Date Noted  . ANA positive 06/19/2017  . DJD (degenerative joint disease), cervical 01/16/2017  . Primary osteoarthritis of both knees 01/16/2017  . Acute pain of right knee 09/26/2016  . Diabetes (HCC) 08/21/2016  . Hypertension 08/21/2016  . Rheumatoid arthritis with rheumatoid factor of multiple sites without organ or systems involvement (HCC) 08/19/2016  . High risk medication use 08/19/2016  . Varicose veins of bilateral lower extremities with other complications 03/25/2014    Past Medical History:  Diagnosis Date  . Anemia   . Diabetes mellitus without complication (HCC)   . Hypertension   . Osteoarthritis   . Rheumatoid arthritis (HCC)     Family History  Problem Relation Age of Onset  . Diabetes Mother   . Hyperlipidemia Mother   . Hypertension Mother   . Hypertension Father   . Hypertension Daughter    Past Surgical History:  Procedure Laterality Date  . MOUTH SURGERY     Social History   Social History Narrative  . Not on file     Objective: Vital Signs: There were no vitals taken for this visit.   Physical Exam   Musculoskeletal Exam: ***  CDAI Exam: CDAI Score: Not documented Patient Global Assessment: Not documented; Provider Global Assessment: Not documented Swollen: Not documented; Tender: Not documented Joint Exam   Not documented   There is currently no information documented on the homunculus. Go to the Rheumatology activity and complete the homunculus joint exam.  Investigation: No additional findings.  Imaging: No results found.  Recent Labs: Lab Results  Component Value Date   WBC 7.4 07/02/2017   HGB 11.9 07/02/2017   PLT 213 07/02/2017   NA 140 08/21/2017   K 4.2 08/21/2017   CL 102 08/21/2017   CO2 29 08/21/2017   GLUCOSE 161 (H) 08/21/2017   BUN 17 08/21/2017   CREATININE 1.18 (H) 08/21/2017   BILITOT 0.3 07/02/2017   ALKPHOS 97 04/23/2017   AST 13 07/02/2017   ALT 7 07/02/2017   PROT 7.0 07/02/2017   ALBUMIN 3.9 04/23/2017   CALCIUM 9.6 08/21/2017   GFRAA 56 (L) 08/21/2017   QFTBGOLD NEGATIVE 08/21/2017    Speciality Comments: No specialty comments available.  Procedures:  No procedures performed Allergies: Levofloxacin and Quinolones   Assessment / Plan:     Visit Diagnoses: No diagnosis found.   Orders: No orders of the defined types were placed in this encounter.  No orders of the defined types were placed in this encounter.   Face-to-face time spent with patient was *** minutes. Greater than 50% of time was spent in counseling and coordination of care.  Follow-Up Instructions: No  follow-ups on file.   Earnestine Mealing, CMA  Note - This record has been created using Editor, commissioning.  Chart creation errors have been sought, but may not always  have been located. Such creation errors do not reflect on  the standard of medical care.

## 2018-07-01 ENCOUNTER — Ambulatory Visit: Payer: BC Managed Care – PPO | Admitting: Rheumatology

## 2018-11-12 NOTE — Progress Notes (Signed)
Office Visit Note  Patient: Jennifer Horn             Date of Birth: 11/07/1950           MRN: [id number]             PCP: Gordan Payment., MD Referring: Gordan Payment., MD Visit Date: 11/26/2018 Occupation: @GUAROCC @  Subjective:  Joint pain.   History of Present Illness: Jennifer Horn is a 68 y.o. female history of seropositive rheumatoid arthritis and osteoarthritis.  She denies any joint swelling.  Although she continues to have some discomfort in her joints with the weather change.  She has occasional discomfort in her left hip and her left wrist joint.  She is currently not having any discomfort in her knees.  Denies any neck discomfort.  Activities of Daily Living:  Patient reports morning stiffness for 0 minute.   Patient Denies nocturnal pain.  Difficulty dressing/grooming: Denies Difficulty climbing stairs: Reports Difficulty getting out of chair: Denies Difficulty using hands for taps, buttons, cutlery, and/or writing: Denies  Review of Systems  Constitutional: Negative for fatigue, night sweats, weight gain and weight loss.  HENT: Negative for mouth sores, trouble swallowing, trouble swallowing, mouth dryness and nose dryness.   Eyes: Negative for pain, redness, visual disturbance and dryness.  Respiratory: Negative for cough, shortness of breath and difficulty breathing.   Cardiovascular: Negative for chest pain, palpitations, hypertension, irregular heartbeat and swelling in legs/feet.  Gastrointestinal: Negative for blood in stool, constipation and diarrhea.  Endocrine: Negative for increased urination.  Genitourinary: Negative for vaginal dryness.  Musculoskeletal: Positive for arthralgias and joint pain. Negative for joint swelling, myalgias, muscle weakness, morning stiffness, muscle tenderness and myalgias.  Skin: Negative for color change, rash, hair loss, skin tightness, ulcers and sensitivity to sunlight.  Allergic/Immunologic: Negative for  susceptible to infections.  Neurological: Negative for dizziness, memory loss, night sweats and weakness.  Hematological: Negative for swollen glands.  Psychiatric/Behavioral: Negative for depressed mood and sleep disturbance. The patient is not nervous/anxious.     PMFS History:  Patient Active Problem List   Diagnosis Date Noted  . ANA positive 06/19/2017  . DJD (degenerative joint disease), cervical 01/16/2017  . Primary osteoarthritis of both knees 01/16/2017  . Acute pain of right knee 09/26/2016  . Diabetes (HCC) 08/21/2016  . Hypertension 08/21/2016  . Rheumatoid arthritis with rheumatoid factor of multiple sites without organ or systems involvement (HCC) 08/19/2016  . High risk medication use 08/19/2016  . Varicose veins of bilateral lower extremities with other complications 03/25/2014    Past Medical History:  Diagnosis Date  . Anemia   . Diabetes mellitus without complication (HCC)   . Hypertension   . Osteoarthritis   . Rheumatoid arthritis (HCC)     Family History  Problem Relation Age of Onset  . Diabetes Mother   . Hyperlipidemia Mother   . Hypertension Mother   . Hypertension Father   . Hypertension Daughter    Past Surgical History:  Procedure Laterality Date  . MOUTH SURGERY     Social History   Social History Narrative  . Not on file   Immunization History  Administered Date(s) Administered  . Pneumococcal Conjugate-13 01/10/2017     Objective: Vital Signs: BP (!) 167/76 (BP Location: Left Arm, Patient Position: Sitting, Cuff Size: Normal)   Pulse 80   Resp 16   Ht 5' 7.5" (1.715 m)   Wt 248 lb 12.8 oz (112.9 kg)   BMI 38.39 kg/m  Physical Exam Vitals signs and nursing note reviewed.  Constitutional:      Appearance: She is well-developed.  HENT:     Head: Normocephalic and atraumatic.  Eyes:     Conjunctiva/sclera: Conjunctivae normal.  Neck:     Musculoskeletal: Normal range of motion.  Cardiovascular:     Rate and Rhythm:  Normal rate and regular rhythm.     Heart sounds: Normal heart sounds.  Pulmonary:     Effort: Pulmonary effort is normal.     Breath sounds: Normal breath sounds.  Abdominal:     General: Bowel sounds are normal.     Palpations: Abdomen is soft.  Lymphadenopathy:     Cervical: No cervical adenopathy.  Skin:    General: Skin is warm and dry.     Capillary Refill: Capillary refill takes less than 2 seconds.  Neurological:     Mental Status: She is alert and oriented to person, place, and time.  Psychiatric:        Behavior: Behavior normal.      Musculoskeletal Exam: C-spine thoracic lumbar spine good range of motion.  Shoulder joints elbow joints wrist joints with good range of motion.  She has DIP and PIP thickening in her hands consistent with osteoarthritis.  She is crepitus in her knee joints without any warmth swelling or effusion.  Other joints are in full range of motion with no synovitis.  CDAI Exam: CDAI Score: 0  Patient Global Assessment: 0 (mm); Provider Global Assessment: 0 (mm) Swollen: 0 ; Tender: 0  Joint Exam   Not documented   There is currently no information documented on the homunculus. Go to the Rheumatology activity and complete the homunculus joint exam.  Investigation: No additional findings.  Imaging: No results found.  Recent Labs: Lab Results  Component Value Date   WBC 7.4 07/02/2017   HGB 11.9 07/02/2017   PLT 213 07/02/2017   NA 140 08/21/2017   K 4.2 08/21/2017   CL 102 08/21/2017   CO2 29 08/21/2017   GLUCOSE 161 (H) 08/21/2017   BUN 17 08/21/2017   CREATININE 1.18 (H) 08/21/2017   BILITOT 0.3 07/02/2017   ALKPHOS 97 04/23/2017   AST 13 07/02/2017   ALT 7 07/02/2017   PROT 7.0 07/02/2017   ALBUMIN 3.9 04/23/2017   CALCIUM 9.6 08/21/2017   GFRAA 56 (L) 08/21/2017   QFTBGOLD NEGATIVE 08/21/2017    Speciality Comments: No specialty comments available.  Procedures:  No procedures performed Allergies: Levofloxacin;  Quinolones; and Leflunomide   Assessment / Plan:     Visit Diagnoses: Rheumatoid arthritis with rheumatoid factor of multiple sites without organ or systems involvement (HCC)-patient has no synovitis on examination.  She appears to be in remission from long time.  High risk medication use - She discontinued Arava in in the past due to developing a rash. MTX was discontinued due to elevated creatinine  Primary osteoarthritis of both hands-she continues to have some stiffness in her hands due to underlying osteoarthritis.  Primary osteoarthritis of both knees-she does not have much discomfort in her knees.  DDD (degenerative disc disease), cervical-she has good range of motion of her cervical spine.  Essential hypertension-her blood pressure is elevated today.  Have advised her to monitor blood pressure closely.  Other medical bums are listed as follows:  Varicose veins of bilateral lower extremities with other complications  History of diabetes mellitus, type II  Unilateral osteoarthritis of first carpometacarpal (CMC) joint  ANA positive  History of renal  insufficiency   Orders: No orders of the defined types were placed in this encounter.  No orders of the defined types were placed in this encounter.     Follow-Up Instructions: Return in about 1 year (around 11/27/2019) for Rheumatoid arthritis, Osteoarthritis.  Patient to notify me if she develops any symptoms.   Pollyann Savoy, MD  Note - This record has been created using Animal nutritionist.  Chart creation errors have been sought, but may not always  have been located. Such creation errors do not reflect on  the standard of medical care.

## 2018-11-26 ENCOUNTER — Encounter: Payer: Self-pay | Admitting: Rheumatology

## 2018-11-26 ENCOUNTER — Ambulatory Visit: Payer: BC Managed Care – PPO | Admitting: Rheumatology

## 2018-11-26 VITALS — BP 167/76 | HR 80 | Resp 16 | Ht 67.5 in | Wt 248.8 lb

## 2018-11-26 DIAGNOSIS — M5030 Other cervical disc degeneration, unspecified cervical region: Secondary | ICD-10-CM

## 2018-11-26 DIAGNOSIS — I83893 Varicose veins of bilateral lower extremities with other complications: Secondary | ICD-10-CM

## 2018-11-26 DIAGNOSIS — R768 Other specified abnormal immunological findings in serum: Secondary | ICD-10-CM

## 2018-11-26 DIAGNOSIS — Z79899 Other long term (current) drug therapy: Secondary | ICD-10-CM

## 2018-11-26 DIAGNOSIS — M19042 Primary osteoarthritis, left hand: Secondary | ICD-10-CM

## 2018-11-26 DIAGNOSIS — M170 Bilateral primary osteoarthritis of knee: Secondary | ICD-10-CM

## 2018-11-26 DIAGNOSIS — Z8639 Personal history of other endocrine, nutritional and metabolic disease: Secondary | ICD-10-CM

## 2018-11-26 DIAGNOSIS — M19041 Primary osteoarthritis, right hand: Secondary | ICD-10-CM | POA: Diagnosis not present

## 2018-11-26 DIAGNOSIS — I10 Essential (primary) hypertension: Secondary | ICD-10-CM

## 2018-11-26 DIAGNOSIS — M189 Osteoarthritis of first carpometacarpal joint, unspecified: Secondary | ICD-10-CM

## 2018-11-26 DIAGNOSIS — M0579 Rheumatoid arthritis with rheumatoid factor of multiple sites without organ or systems involvement: Secondary | ICD-10-CM | POA: Diagnosis not present

## 2018-11-26 DIAGNOSIS — Z87448 Personal history of other diseases of urinary system: Secondary | ICD-10-CM

## 2019-02-11 ENCOUNTER — Other Ambulatory Visit: Payer: Self-pay | Admitting: Physician Assistant

## 2019-02-11 NOTE — Telephone Encounter (Signed)
Ok to refill 

## 2019-02-11 NOTE — Telephone Encounter (Signed)
Last Visit: 11/26/2018 Next Visit: 11/25/2019  Okay to refill per Dr.Deveshwar.

## 2019-03-06 LAB — HM DIABETES EYE EXAM

## 2019-06-05 ENCOUNTER — Ambulatory Visit: Payer: BC Managed Care – PPO | Admitting: Surgery

## 2019-06-11 ENCOUNTER — Encounter: Payer: Self-pay | Admitting: Surgery

## 2019-06-11 ENCOUNTER — Ambulatory Visit (INDEPENDENT_AMBULATORY_CARE_PROVIDER_SITE_OTHER): Payer: BC Managed Care – PPO | Admitting: Surgery

## 2019-06-11 ENCOUNTER — Ambulatory Visit: Payer: BC Managed Care – PPO

## 2019-06-11 DIAGNOSIS — M25561 Pain in right knee: Secondary | ICD-10-CM

## 2019-06-11 NOTE — Progress Notes (Signed)
68 year old white female history of rheumatoid arthritis and right knee DJD comes in.  I had seen patient March 2019 for the same complaint.  She states that her RA has been under good control.  She had a flareup of knee pain a few weeks ago and states that she had some discomfort with pivoting type movements in her knee would "stick".  She has been better the last 2 weeks.  Knee problem comes and goes.  Patient is also had problems with right first [facility] joint pain.  This was injected by rheumatology clinic last year.  She has been using Voltaren gel with some improvement.  Exam Patient does have some right knee soreness at the joint line.  Some patellofemoral crepitus.  Plan Since patient is doing well at this point in regards to her knee she will continue home conservative management.  Follow-up in 3 months for recheck.  If knee flares up before that time I did discuss possibly doing intra-articular Marcaine/Depo-Medrol injection.  I reviewed x-rays with her today as well and ultimately it will come down her needing definitive treatment with total knee replacement but of course we will exhaust all conservative measures before going that route.  Also did discuss viscosupplementation as an injection option.  Regards to her right thumb pain she will discuss this again at rheumatology appointment next month.  If this continues to be a problem I did discuss referral to hand specialist Dr. Roseanne Kaufman.  She will let me know.

## 2019-09-17 ENCOUNTER — Ambulatory Visit: Payer: BC Managed Care – PPO | Admitting: Surgery

## 2019-10-17 HISTORY — PX: OTHER SURGICAL HISTORY: SHX169

## 2019-11-17 NOTE — Progress Notes (Signed)
Office Visit Note  Patient: Jennifer Horn             Date of Birth: 04-Feb-1951           MRN: [id number]             PCP: Raina Mina., MD Referring: Raina Mina., MD Visit Date: 11/25/2019 Occupation: @GUAROCC @  Subjective:  Right hand pain   History of Present Illness: Jennifer Horn is a 69 y.o. female with history of seropositive rheumatoid arthritis, osteoarthritis, and DDD.  Patient is not taking any immunosuppressive agents at this time.  She denies any recent rheumatoid arthritis flares.  She states that she is having some discomfort in the right hand last week when she was working on the computer more than usual.  She denies any joint swelling at that time.  She states that she uses Voltaren gel topically as needed and takes Tylenol as needed for pain relief.  She denies any other joint pain or joint swelling at this time.  She denies any morning stiffness.    Activities of Daily Living:  Patient reports morning stiffness for 0 minutes.   Patient Denies nocturnal pain.  Difficulty dressing/grooming: Denies Difficulty climbing stairs: Denies Difficulty getting out of chair: Denies Difficulty using hands for taps, buttons, cutlery, and/or writing: Denies  Review of Systems  Constitutional: Negative for fatigue.  HENT: Negative for mouth sores, mouth dryness and nose dryness.   Eyes: Negative for itching and dryness.  Respiratory: Negative for shortness of breath, wheezing and difficulty breathing.   Cardiovascular: Negative for chest pain and palpitations.  Gastrointestinal: Negative for blood in stool, constipation and diarrhea.  Endocrine: Negative for increased urination.  Genitourinary: Negative for difficulty urinating and painful urination.  Musculoskeletal: Negative for arthralgias, joint pain, joint swelling and morning stiffness.  Skin: Negative for rash.  Allergic/Immunologic: Negative for susceptible to infections.  Neurological: Negative for  dizziness, headaches, memory loss and weakness.  Hematological: Negative for bruising/bleeding tendency.  Psychiatric/Behavioral: Negative for confusion and sleep disturbance.    PMFS History:  Patient Active Problem List   Diagnosis Date Noted  . ANA positive 06/19/2017  . DJD (degenerative joint disease), cervical 01/16/2017  . Primary osteoarthritis of both knees 01/16/2017  . Acute pain of right knee 09/26/2016  . Diabetes ([place]) 08/21/2016  . Hypertension 08/21/2016  . Rheumatoid arthritis with rheumatoid factor of multiple sites without organ or systems involvement ([place]) 08/19/2016  . High risk medication use 08/19/2016  . Varicose veins of bilateral lower extremities with other complications 62/69/4854    Past Medical History:  Diagnosis Date  . Anemia   . Diabetes mellitus without complication ([place])   . Hypertension   . Osteoarthritis   . Rheumatoid arthritis ([place])     Family History  Problem Relation Age of Onset  . Diabetes Mother   . Hyperlipidemia Mother   . Hypertension Mother   . Hypertension Father   . Hypertension Daughter    Past Surgical History:  Procedure Laterality Date  . cornea ablation  10/2019  . MOUTH SURGERY     Social History   Social History Narrative  . Not on file   Immunization History  Administered Date(s) Administered  . Pneumococcal Conjugate-13 01/10/2017     Objective: Vital Signs: BP (!) 177/77 (BP Location: Left Arm, Patient Position: Sitting, Cuff Size: Large)   Pulse 75   Resp 14   Ht 5' 7.5" (1.715 m)   Wt 249 lb (112.9 kg)  BMI 38.42 kg/m    Physical Exam Vitals and nursing note reviewed.  Constitutional:      Appearance: She is well-developed.  HENT:     Head: Normocephalic and atraumatic.  Eyes:     Conjunctiva/sclera: Conjunctivae normal.  Cardiovascular:     Rate and Rhythm: Normal rate and regular rhythm.     Heart sounds: Normal heart sounds.  Pulmonary:     Effort: Pulmonary effort is normal.      Breath sounds: Normal breath sounds.  Abdominal:     General: Bowel sounds are normal.     Palpations: Abdomen is soft.  Musculoskeletal:     Cervical back: Normal range of motion.  Lymphadenopathy:     Cervical: No cervical adenopathy.  Skin:    General: Skin is warm and dry.     Capillary Refill: Capillary refill takes less than 2 seconds.  Neurological:     Mental Status: She is alert and oriented to person, place, and time.  Psychiatric:        Behavior: Behavior normal.      Musculoskeletal Exam: C-spine, thoracic spine, lumbar spine good range of motion.  No midline spinal tenderness.  No SI joint tenderness.  Shoulder joints, elbow joints, wrist joints, MCPs and PIPs, DIPs good range of motion with no synovitis.  She has complete fist formation bilaterally.  DIP synovial thickening consistent with osteoarthritis of both hands.  Hip joints, knee joints and ankle joints, MTPs, PIPs and DIPs good range of motion with no synovitis.  No warmth or effusion of bilateral knee joints.  No tenderness or swelling of ankle joints.  Severe pedal edema noted bilaterally.  CDAI Exam: CDAI Score: 0  Patient Global: 0 mm; Provider Global: 0 mm Swollen: 0 ; Tender: 0  Joint Exam 11/25/2019   No joint exam has been documented for this visit   There is currently no information documented on the homunculus. Go to the Rheumatology activity and complete the homunculus joint exam.  Investigation: No additional findings.  Imaging: No results found.  Recent Labs: Lab Results  Component Value Date   WBC 7.4 07/02/2017   HGB 11.9 07/02/2017   PLT 213 07/02/2017   NA 140 08/21/2017   K 4.2 08/21/2017   CL 102 08/21/2017   CO2 29 08/21/2017   GLUCOSE 161 (H) 08/21/2017   BUN 17 08/21/2017   CREATININE 1.18 (H) 08/21/2017   BILITOT 0.3 07/02/2017   ALKPHOS 97 04/23/2017   AST 13 07/02/2017   ALT 7 07/02/2017   PROT 7.0 07/02/2017   ALBUMIN 3.9 04/23/2017   CALCIUM 9.6 08/21/2017    GFRAA 56 (L) 08/21/2017   QFTBGOLD NEGATIVE 08/21/2017    Speciality Comments: No specialty comments available.  Procedures:  No procedures performed Allergies: Levofloxacin, Quinolones, and Leflunomide   Assessment / Plan:     Visit Diagnoses: Rheumatoid arthritis with rheumatoid factor of multiple sites without organ or systems involvement [facility]): She has no synovitis on exam.  She has not had any recent rheumatoid arthritis flares.  She is not taking any immunosuppressive agents at this time.  She has not had a rheumatoid arthritis flare in over 2 years according to the patient.  She states last week she was experiencing increased discomfort in the right hand which she attributes to overuse while working from home.  She has no tenderness or synovitis on exam today.  She has complete fist formation bilaterally.  She has not been experiencing any morning stiffness.  She  has no difficulties with ADLs.  She has no other joint pain or joint swelling at this time.  She uses Voltaren gel topically as needed and Tylenol as needed for pain relief.  She does not require immunosuppression at this time.  She was advised to notify us if she develops increased joint pain or joint swelling.  She will follow-up in the office in 1 year.   High risk medication use - She discontinued Arava in in the past due to developing a rash. MTX was discontinued due to elevated creatinine.   Primary osteoarthritis of both hands: She has DIP synovial thickening consistent with osteoarthritis of both hands.  She has no tenderness or synovitis on exam.  She has complete fist formation bilaterally.  Joint protection and muscle strengthening were discussed.  She was given a refill Voltaren gel which she can apply topically as needed for pain relief.  Primary osteoarthritis of both knees: She has good range of motion of bilateral knee joints with no discomfort.  No warmth or effusion was noted.  She has no discomfort in her knee  joints.  DDD (degenerative disc disease), cervical: She has good range of motion with no discomfort.  No symptoms of radiculopathy.  She has occasional trapezius muscle tension and tenderness and performs neck exercises and uses Voltaren gel topically.  Other medical conditions are listed as follows:  Essential hypertension  Varicose veins of bilateral lower extremities with other complications  History of diabetes mellitus, type II  ANA positive  History of renal insufficiency  Orders: No orders of the defined types were placed in this encounter.  Meds ordered this encounter  Medications  . diclofenac Sodium (VOLTAREN) 1 % GEL    Sig: Apply 2-4 grams to affected joint 4 times daily as needed.    Dispense:  400 g    Refill:  2      Follow-Up Instructions: Return in about 1 year (around 11/24/2020) for Rheumatoid arthritis, Osteoarthritis.   Gearldine Bienenstock, PA-C   I examined and evaluated the patient with Sherron Ales PA.  Patient continues to do well.  She has no recurrence of rheumatoid arthritis.  She does have some osteoarthritic changes.  She had no synovitis on examination.  She has been advised to contact us in case she develops any increased swelling.  The plan of care was discussed as noted above.  Pollyann Savoy, MD  Note - This record has been created using Animal nutritionist.  Chart creation errors have been sought, but may not always  have been located. Such creation errors do not reflect on  the standard of medical care.

## 2019-11-25 ENCOUNTER — Other Ambulatory Visit: Payer: Self-pay

## 2019-11-25 ENCOUNTER — Ambulatory Visit: Payer: BC Managed Care – PPO | Admitting: Rheumatology

## 2019-11-25 ENCOUNTER — Encounter: Payer: Self-pay | Admitting: Rheumatology

## 2019-11-25 VITALS — BP 177/77 | HR 75 | Resp 14 | Ht 67.5 in | Wt 249.0 lb

## 2019-11-25 DIAGNOSIS — M170 Bilateral primary osteoarthritis of knee: Secondary | ICD-10-CM

## 2019-11-25 DIAGNOSIS — M19041 Primary osteoarthritis, right hand: Secondary | ICD-10-CM

## 2019-11-25 DIAGNOSIS — I83893 Varicose veins of bilateral lower extremities with other complications: Secondary | ICD-10-CM

## 2019-11-25 DIAGNOSIS — Z87448 Personal history of other diseases of urinary system: Secondary | ICD-10-CM

## 2019-11-25 DIAGNOSIS — Z79899 Other long term (current) drug therapy: Secondary | ICD-10-CM | POA: Diagnosis not present

## 2019-11-25 DIAGNOSIS — M0579 Rheumatoid arthritis with rheumatoid factor of multiple sites without organ or systems involvement: Secondary | ICD-10-CM

## 2019-11-25 DIAGNOSIS — M19042 Primary osteoarthritis, left hand: Secondary | ICD-10-CM

## 2019-11-25 DIAGNOSIS — M5030 Other cervical disc degeneration, unspecified cervical region: Secondary | ICD-10-CM

## 2019-11-25 DIAGNOSIS — Z8639 Personal history of other endocrine, nutritional and metabolic disease: Secondary | ICD-10-CM

## 2019-11-25 DIAGNOSIS — R768 Other specified abnormal immunological findings in serum: Secondary | ICD-10-CM

## 2019-11-25 DIAGNOSIS — I10 Essential (primary) hypertension: Secondary | ICD-10-CM

## 2019-11-25 MED ORDER — DICLOFENAC SODIUM 1 % EX GEL: CUTANEOUS | 2 refills | Status: AC

## 2019-12-10 ENCOUNTER — Encounter: Payer: Self-pay | Admitting: Surgery

## 2019-12-10 ENCOUNTER — Ambulatory Visit: Payer: BC Managed Care – PPO | Admitting: Surgery

## 2019-12-10 ENCOUNTER — Other Ambulatory Visit: Payer: Self-pay

## 2019-12-10 ENCOUNTER — Ambulatory Visit: Payer: Self-pay

## 2019-12-10 DIAGNOSIS — M1712 Unilateral primary osteoarthritis, left knee: Secondary | ICD-10-CM

## 2019-12-10 DIAGNOSIS — M25562 Pain in left knee: Secondary | ICD-10-CM | POA: Diagnosis not present

## 2019-12-10 MED ORDER — LIDOCAINE HCL 1 % IJ SOLN
3.0000 mL | INTRAMUSCULAR | Status: AC | PRN
  Administered 2019-12-10: 3 mL

## 2019-12-10 MED ORDER — BUPIVACAINE HCL 0.25 % IJ SOLN
6.0000 mL | INTRAMUSCULAR | Status: AC | PRN
  Administered 2019-12-10: 6 mL via INTRA_ARTICULAR

## 2019-12-10 MED ORDER — METHYLPREDNISOLONE ACETATE 40 MG/ML IJ SUSP
40.0000 mg | INTRAMUSCULAR | Status: AC | PRN
  Administered 2019-12-10: 40 mg via INTRA_ARTICULAR

## 2019-12-10 NOTE — Progress Notes (Signed)
Office Visit Note   Patient: Jennifer Horn           Date of Birth: January 13, 1951           MRN: [id number] Visit Date: 12/10/2019              Requested by: Raina Mina., MD [place],  [place] [address] PCP: Raina Mina., MD   Assessment & Plan: Visit Diagnoses:  1. Acute pain of left knee   2. Arthritis of left knee     Plan: Fingerstick glucose in clinic today 151.  I agree to try conservative treatment with Marcaine/Depo-Medrol injection.  After patient consent left knee injection performed.  Advised patient that she must strictly monitor her blood sugars and watch her diet because of the potential for elevation from today's injection.  With the degenerative changes that patient has ultimately will come down to her needing total knee replacement.  Hopefully she can get good response with today's injection.  I will have her follow-up with Dr. Lorin Mercy in 3 months for recheck.  Follow-Up Instructions: Return in about 3 months (around 03/08/2020) for With Dr. Lorin Mercy.   Orders:  Orders Placed This Encounter  Procedures  . Large Joint Inj  . XR KNEE 3 VIEW LEFT   No orders of the defined types were placed in this encounter.     Procedures: Large Joint Inj: L knee on 12/10/2019 10:31 AM Indications: pain Details: 25 G 1.5 in needle, anteromedial approach Medications: 3 mL lidocaine 1 %; 6 mL bupivacaine 0.25 %; 40 mg methylPREDNISolone acetate 40 MG/ML Outcome: tolerated well, no immediate complications Consent was given by the patient. Patient was prepped and draped in the usual sterile fashion.       Clinical Data: No additional findings.   Subjective: Chief Complaint  Patient presents with  . Left Knee - Pain    HPI 69 year old white female comes in today with complaints of left knee pain.  States that pain started this past weekend when she got of her car.  Patient has known history of RA and bilateral knee DJD.  She is followed by  rheumatologist for the RA.  Patient last seen by me August 2020 for right knee DJD and we had discussed injection at that point but knee is currently doing well.  Left knee pain aggravated with ambulation getting up out of a chair.  Patient is a history of type 2 diabetes but did not check her blood sugar this morning. Review of Systems No current cardiac pulmonary GI GU issues Objective: Vital Signs: There were no vitals taken for this visit.  Physical Exam Constitutional:      Appearance: She is obese.  HENT:     Head: Normocephalic and atraumatic.  Eyes:     Extraocular Movements: Extraocular movements intact.     Pupils: Pupils are equal, round, and reactive to light.  Pulmonary:     Effort: No respiratory distress.  Musculoskeletal:     Comments: Gait antalgic.  Left knee positive crepitus.  Joint line tender.  Neurovascular intact.  Neurological:     General: No focal deficit present.     Mental Status: She is oriented to person, place, and time.  Psychiatric:        Mood and Affect: Mood normal.     Ortho Exam  Specialty Comments:  No specialty comments available.  Imaging: No results found.   PMFS History: Patient Active Problem List   Diagnosis  Date Noted  . ANA positive 06/19/2017  . DJD (degenerative joint disease), cervical 01/16/2017  . Primary osteoarthritis of both knees 01/16/2017  . Acute pain of right knee 09/26/2016  . Diabetes ([facility]) 08/21/2016  . Hypertension 08/21/2016  . Rheumatoid arthritis with rheumatoid factor of multiple sites without organ or systems involvement ([facility]) 08/19/2016  . High risk medication use 08/19/2016  . Varicose veins of bilateral lower extremities with other complications 03/25/2014   Past Medical History:  Diagnosis Date  . Anemia   . Diabetes mellitus without complication ([facility])   . Hypertension   . Osteoarthritis   . Rheumatoid arthritis ([facility])     Family History  Problem Relation Age of Onset  . Diabetes Mother    . Hyperlipidemia Mother   . Hypertension Mother   . Hypertension Father   . Hypertension Daughter     Past Surgical History:  Procedure Laterality Date  . cornea ablation  10/2019  . MOUTH SURGERY     Social History   Occupational History  . Not on file  Tobacco Use  . Smoking status: Never Smoker  . Smokeless tobacco: Never Used  Substance and Sexual Activity  . Alcohol use: Yes    Comment: Occassionally/rare  . Drug use: No  . Sexual activity: Not on file

## 2020-03-01 ENCOUNTER — Encounter: Payer: Self-pay | Admitting: Rheumatology

## 2020-03-01 LAB — HM DIABETES EYE EXAM

## 2020-11-09 NOTE — Progress Notes (Signed)
Office Visit Note  Patient: Jennifer Horn             Date of Birth: 02/22/1951           MRN: [id number]             PCP: Jennifer Horn., MD Referring: Jennifer Horn., MD Visit Date: 11/23/2020 Occupation: @GUAROCC @  Subjective:  Joint stiffness.   History of Present Illness: Jennifer Horn is a 70 y.o. female with history of rheumatoid arthritis and osteoarthritis.  She has been off leflunomide since April 2019.  Prior to leflunomide she was on methotrexate which she discontinued in September 2018.  She has not had any flare of rheumatoid arthritis.  She continues to have some joint stiffness due to osteoarthritis.  She complains of some stiffness off and on in her hands and her knee joints with the weather change.  She has not noticed any joint swelling.  Activities of Daily Living:  Patient reports morning stiffness for 5 minutes.   Patient Denies nocturnal pain.  Difficulty dressing/grooming: Denies Difficulty climbing stairs: Denies Difficulty getting out of chair: Denies Difficulty using hands for taps, buttons, cutlery, and/or writing: Denies  Review of Systems  Constitutional: Negative for fatigue.  HENT: Negative for mouth sores, mouth dryness and nose dryness.   Eyes: Negative for pain, itching and dryness.  Respiratory: Negative for shortness of breath and difficulty breathing.   Cardiovascular: Negative for chest pain and palpitations.  Gastrointestinal: Negative for blood in stool, constipation and diarrhea.  Endocrine: Negative for increased urination.  Genitourinary: Negative for difficulty urinating.  Musculoskeletal: Positive for morning stiffness. Negative for arthralgias, joint pain, joint swelling, myalgias, muscle tenderness and myalgias.  Skin: Negative for color change, rash and redness.  Allergic/Immunologic: Negative for susceptible to infections.  Neurological: Negative for dizziness, numbness, headaches, memory loss and weakness.   Hematological: Negative for bruising/bleeding tendency.  Psychiatric/Behavioral: Negative for confusion.    PMFS History:  Patient Active Problem List   Diagnosis Date Noted  . ANA positive 06/19/2017  . DJD (degenerative joint disease), cervical 01/16/2017  . Primary osteoarthritis of both knees 01/16/2017  . Acute pain of right knee 09/26/2016  . Diabetes ([facility]) 08/21/2016  . Hypertension 08/21/2016  . Rheumatoid arthritis with rheumatoid factor of multiple sites without organ or systems involvement ([facility]) 08/19/2016  . High risk medication use 08/19/2016  . Varicose veins of bilateral lower extremities with other complications 03/25/2014    Past Medical History:  Diagnosis Date  . Anemia   . Diabetes mellitus without complication ([facility])   . Hypertension   . Osteoarthritis   . Rheumatoid arthritis ([facility])     Family History  Problem Relation Age of Onset  . Diabetes Mother   . Hyperlipidemia Mother   . Hypertension Mother   . Hypertension Father   . Hypertension Daughter    Past Surgical History:  Procedure Laterality Date  . cornea ablation  10/2019  . MOUTH SURGERY     Social History   Social History Narrative  . Not on file   Immunization History  Administered Date(s) Administered  . PFIZER(Purple Top)SARS-COV-2 Vaccination 08/05/2020  . Pneumococcal Conjugate-13 01/10/2017     Objective: Vital Signs: BP (!) 144/88 (BP Location: Left Arm, Patient Position: Sitting, Cuff Size: Large)   Pulse 83   Resp 17   Ht 5\' 8"  (1.727 m)   Wt 245 lb 3.2 oz (111.2 kg)   BMI 37.28 kg/m    Physical Exam Vitals and  nursing note reviewed.  Constitutional:      Appearance: She is well-developed and well-nourished.  HENT:     Head: Normocephalic and atraumatic.  Eyes:     Extraocular Movements: EOM normal.     Conjunctiva/sclera: Conjunctivae normal.  Cardiovascular:     Rate and Rhythm: Normal rate and regular rhythm.     Pulses: Intact distal pulses.     Heart  sounds: Normal heart sounds.  Pulmonary:     Effort: Pulmonary effort is normal.     Breath sounds: Normal breath sounds.  Abdominal:     General: Bowel sounds are normal.     Palpations: Abdomen is soft.  Musculoskeletal:     Cervical back: Normal range of motion.  Lymphadenopathy:     Cervical: No cervical adenopathy.  Skin:    General: Skin is warm and dry.     Capillary Refill: Capillary refill takes less than 2 seconds.  Neurological:     Mental Status: She is alert and oriented to person, place, and time.  Psychiatric:        Mood and Affect: Mood and affect normal.        Behavior: Behavior normal.      Musculoskeletal Exam: C-spine was in good range of motion without discomfort.  Shoulder joints, elbow joints, wrist joints with good range of motion.  She had bilateral PIP and DIP thickening with mild synovitis.  Hip joints, knee joints, ankles with good range of motion.  She had no tenderness over ankles or MTPs.  CDAI Exam: CDAI Score: -- Patient Global: --; Provider Global: -- Swollen: --; Tender: -- Joint Exam 11/23/2020   No joint exam has been documented for this visit   There is currently no information documented on the homunculus. Go to the Rheumatology activity and complete the homunculus joint exam.  Investigation: No additional findings.  Imaging: No results found.  Recent Labs: Lab Results  Component Value Date   WBC 7.4 07/02/2017   HGB 11.9 07/02/2017   PLT 213 07/02/2017   NA 140 08/21/2017   K 4.2 08/21/2017   CL 102 08/21/2017   CO2 29 08/21/2017   GLUCOSE 161 (H) 08/21/2017   BUN 17 08/21/2017   CREATININE 1.18 (H) 08/21/2017   BILITOT 0.3 07/02/2017   ALKPHOS 97 04/23/2017   AST 13 07/02/2017   ALT 7 07/02/2017   PROT 7.0 07/02/2017   ALBUMIN 3.9 04/23/2017   CALCIUM 9.6 08/21/2017   GFRAA 56 (L) 08/21/2017   QFTBGOLD NEGATIVE 08/21/2017    Speciality Comments: No specialty comments available.  Procedures:  No procedures  performed Allergies: Levofloxacin, Quinolones, and Leflunomide   Assessment / Plan:     Visit Diagnoses: Rheumatoid arthritis with rheumatoid factor of multiple sites without organ or systems involvement ([facility])-she has no synovitis on my examination.  She denies any history of joint swelling.  High risk medication use - She discontinued Arava in in the past due to developing a rash. MTX was discontinued due to elevated creatinine.   Primary osteoarthritis of both hands-she has some stiffness in her hands but no synovitis was noted.  DIP and PIP prominence was noted.  Primary osteoarthritis of both knees-she has occasional discomfort in her knee joints with the weather change.  DDD (degenerative disc disease), cervical-she had good range of motion of her cervical spine without discomfort.  Essential hypertension-blood pressure was elevated today.  Have advised her to monitor blood pressure closely.  History of diabetes mellitus, type II  Varicose veins of bilateral lower extremities with other complications  ANA positive  History of renal insufficiency-I do not have recent labs.  In 2018 her GFR was in 50s.    Orders: No orders of the defined types were placed in this encounter.  No orders of the defined types were placed in this encounter.   Follow-Up Instructions: Return in about 1 year (around 11/23/2021) for Rheumatoid arthritis, Osteoarthritis.   Pollyann Savoy, MD  Note - This record has been created using Animal nutritionist.  Chart creation errors have been sought, but may not always  have been located. Such creation errors do not reflect on  the standard of medical care.

## 2020-11-23 ENCOUNTER — Ambulatory Visit: Payer: BC Managed Care – PPO | Admitting: Rheumatology

## 2020-11-23 ENCOUNTER — Other Ambulatory Visit: Payer: Self-pay

## 2020-11-23 ENCOUNTER — Encounter: Payer: Self-pay | Admitting: Rheumatology

## 2020-11-23 VITALS — BP 144/88 | HR 83 | Resp 17 | Ht 68.0 in | Wt 245.2 lb

## 2020-11-23 DIAGNOSIS — M170 Bilateral primary osteoarthritis of knee: Secondary | ICD-10-CM | POA: Diagnosis not present

## 2020-11-23 DIAGNOSIS — M0579 Rheumatoid arthritis with rheumatoid factor of multiple sites without organ or systems involvement: Secondary | ICD-10-CM | POA: Diagnosis not present

## 2020-11-23 DIAGNOSIS — Z79899 Other long term (current) drug therapy: Secondary | ICD-10-CM

## 2020-11-23 DIAGNOSIS — R768 Other specified abnormal immunological findings in serum: Secondary | ICD-10-CM

## 2020-11-23 DIAGNOSIS — I10 Essential (primary) hypertension: Secondary | ICD-10-CM

## 2020-11-23 DIAGNOSIS — M19041 Primary osteoarthritis, right hand: Secondary | ICD-10-CM

## 2020-11-23 DIAGNOSIS — M5030 Other cervical disc degeneration, unspecified cervical region: Secondary | ICD-10-CM

## 2020-11-23 DIAGNOSIS — Z87448 Personal history of other diseases of urinary system: Secondary | ICD-10-CM

## 2020-11-23 DIAGNOSIS — Z8639 Personal history of other endocrine, nutritional and metabolic disease: Secondary | ICD-10-CM

## 2020-11-23 DIAGNOSIS — M19042 Primary osteoarthritis, left hand: Secondary | ICD-10-CM

## 2020-11-23 DIAGNOSIS — I83893 Varicose veins of bilateral lower extremities with other complications: Secondary | ICD-10-CM

## 2021-02-22 LAB — HM DIABETES EYE EXAM

## 2021-08-16 DIAGNOSIS — M71572 Other bursitis, not elsewhere classified, left ankle and foot: Secondary | ICD-10-CM | POA: Diagnosis not present

## 2021-11-22 ENCOUNTER — Ambulatory Visit: Payer: BC Managed Care – PPO | Admitting: Physician Assistant

## 2021-11-28 NOTE — Progress Notes (Signed)
Office Visit Note  Patient: Jennifer Horn             Date of Birth: [birth year]           MRN: [id number]             PCP: Raina Mina., MD Referring: Raina Mina., MD Visit Date: 12/12/2021 Occupation: @GUAROCC @  Subjective:  Pain in both knees   History of Present Illness: Jennifer Horn is a 71 y.o. female with history of seropositive rheumatoid arthritis, osteoarthritis, and DDD.  She is not currently taking any immunosuppressive agents.  She denies any signs or symptoms of a rheumatoid arthritis flare.  She experiences occasional discomfort in both knee joints with rainy or cool weather temperatures.  She denies any joint swelling during these episodes.  She takes Tylenol very sparingly for symptomatic relief.  She denies any other joint pain or joint swelling at this time.  She was started on Ozempic in November 2022 and has had about 30 pounds of weight loss which she feels has also been helping her knee joint pain.  Activities of Daily Living:  Patient reports morning stiffness for 0 minutes.   Patient Denies nocturnal pain.  Difficulty dressing/grooming: Denies Difficulty climbing stairs: Denies Difficulty getting out of chair: Denies Difficulty using hands for taps, buttons, cutlery, and/or writing: Denies  Review of Systems  Constitutional:  Negative for fatigue.  HENT:  Negative for mouth sores, mouth dryness and nose dryness.   Eyes:  Positive for dryness. Negative for pain and itching.  Respiratory:  Negative for shortness of breath and difficulty breathing.   Cardiovascular:  Negative for chest pain and palpitations.  Gastrointestinal:  Positive for diarrhea. Negative for blood in stool and constipation.  Endocrine: Negative for increased urination.  Genitourinary:  Negative for difficulty urinating.  Musculoskeletal:  Negative for joint pain, joint pain, joint swelling, myalgias, morning stiffness, muscle tenderness and myalgias.  Skin:  Negative for  color change, rash and redness.  Allergic/Immunologic: Negative for susceptible to infections.  Neurological:  Negative for dizziness, numbness, headaches, memory loss and weakness.  Hematological:  Negative for bruising/bleeding tendency.  Psychiatric/Behavioral:  Negative for confusion.    PMFS History:  Patient Active Problem List   Diagnosis Date Noted   ANA positive 06/19/2017   DJD (degenerative joint disease), cervical 01/16/2017   Primary osteoarthritis of both knees 01/16/2017   Acute pain of right knee 09/26/2016   Diabetes ([place]) 08/21/2016   Hypertension 08/21/2016   Rheumatoid arthritis with rheumatoid factor of multiple sites without organ or systems involvement ([place]) 08/19/2016   High risk medication use 08/19/2016   Varicose veins of bilateral lower extremities with other complications [id number]    Past Medical History:  Diagnosis Date   Anemia    Diabetes mellitus without complication (HCC)    Hypertension    Osteoarthritis    Rheumatoid arthritis (Newborn)     Family History  Problem Relation Age of Onset   Diabetes Mother    Hyperlipidemia Mother    Hypertension Mother    Hypertension Father    Hypertension Daughter    Past Surgical History:  Procedure Laterality Date   cornea ablation  10/2019   MOUTH SURGERY     Social History   Social History Narrative   Not on file   Immunization History  Administered Date(s) Administered   PFIZER(Purple Top)SARS-COV-2 Vaccination 08/05/2020   Pfizer Covid-19 Vaccine Bivalent Booster 69yrs & up 07/12/2021   Pneumococcal Conjugate-13 01/10/2017  Objective: Vital Signs: BP (!) 148/82 (BP Location: Left Arm, Patient Position: Sitting, Cuff Size: Large)    Pulse 77    Ht 5' 7.5" (1.715 m)    Wt 223 lb (101.2 kg)    BMI 34.41 kg/m    Physical Exam Vitals and nursing note reviewed.  Constitutional:      Appearance: She is well-developed.  HENT:     Head: Normocephalic and atraumatic.  Eyes:      Conjunctiva/sclera: Conjunctivae normal.  Cardiovascular:     Rate and Rhythm: Normal rate and regular rhythm.     Heart sounds: Normal heart sounds.  Pulmonary:     Effort: Pulmonary effort is normal.     Breath sounds: Normal breath sounds.  Abdominal:     General: Bowel sounds are normal.     Palpations: Abdomen is soft.  Musculoskeletal:     Cervical back: Normal range of motion.  Lymphadenopathy:     Cervical: No cervical adenopathy.  Skin:    General: Skin is warm and dry.     Capillary Refill: Capillary refill takes less than 2 seconds.  Neurological:     Mental Status: She is alert and oriented to person, place, and time.  Psychiatric:        Behavior: Behavior normal.     Musculoskeletal Exam: C-spine, thoracic spine, lumbar spine have good range of motion with no discomfort.  No midline spinal tenderness was noted.  Shoulder joints, elbow joints, wrist joints, MCPs, PIPs, DIPs have good range of motion with no synovitis.  Complete fist formation bilaterally.  Hip joints have good range of motion with no groin pain.  Knee joints have crepitus bilaterally.  No warmth or effusion of knee joints noted.  Ankle joints have good range of motion with no tenderness or joint swelling.  Pedal edema noted bilaterally.  CDAI Exam: CDAI Score: 0  Patient Global: 0 mm; Provider Global: 0 mm Swollen: 0 ; Tender: 0  Joint Exam 12/12/2021   No joint exam has been documented for this visit   There is currently no information documented on the homunculus. Go to the Rheumatology activity and complete the homunculus joint exam.  Investigation: No additional findings.  Imaging: No results found.  Recent Labs: Lab Results  Component Value Date   WBC 7.4 07/02/2017   HGB 11.9 07/02/2017   PLT 213 07/02/2017   NA 140 08/21/2017   K 4.2 08/21/2017   CL 102 08/21/2017   CO2 29 08/21/2017   GLUCOSE 161 (H) 08/21/2017   BUN 17 08/21/2017   CREATININE 1.18 (H) 08/21/2017   BILITOT  0.3 07/02/2017   ALKPHOS 97 04/23/2017   AST 13 07/02/2017   ALT 7 07/02/2017   PROT 7.0 07/02/2017   ALBUMIN 3.9 04/23/2017   CALCIUM 9.6 08/21/2017   GFRAA 56 (L) 08/21/2017   QFTBGOLD NEGATIVE 08/21/2017    Speciality Comments: Leflunomide-discontinued April 2019 due to rash Methotrexate discontinued September 2018-elevated creatinine  Procedures:  No procedures performed Allergies: Levofloxacin, Quinolones, and Leflunomide   Assessment / Plan:     Visit Diagnoses: Rheumatoid arthritis with rheumatoid factor of multiple sites without organ or systems involvement ([place]): She has no joint tenderness or synovitis on examination today.  She has not had any signs or symptoms of a rheumatoid arthritis since her last office visit.  She has not currently taking any immunosuppressive agents.  Her rheumatoid arthritis remains in remission.  She has occasional discomfort in her knee joints due to having  osteoarthritis.  Her knee joint pain is exacerbated by rainy weather.  She is not experiencing any warmth or effusion in her knee joints. She does not require immunosuppressive therapy at this time.  She was advised to notify us if she develops increased joint pain or joint swelling.  She will follow-up in the office in 1 year or sooner if needed.  High risk medication use -She is not currently taking any immunosuppressive agents.  She discontinued Arava in in the past due to developing a rash. MTX was discontinued due to elevated creatinine.   Primary osteoarthritis of both hands: She has PIP and DIP thickening consistent with osteoarthritis of both hands.  No tenderness or inflammation was noted on examination.  She was able to make a complete fist bilaterally.  Discussed the importance of joint protection and muscle strengthening.  Primary osteoarthritis of both knees: She experiences intermittent discomfort in both knee joints due to underlying osteoarthritis.  Her symptoms are typically  exacerbated by rainy weather or cooler weather temperatures.  She has good range of motion of both knee joints on examination today with crepitus but no warmth or effusion was noted. She was recently started on Ozempic and has been working on weight loss.  According to the patient she has lost about 30 pounds of weight since November 2022.  She plans on increasing her exercise regimen as tolerated.  DDD (degenerative disc disease), cervical: She has good range of motion of the C-spine with no discomfort at this time.  ANA positive: She has no clinical features of systemic lupus at this time.  Other medical conditions are listed as follows:  Essential hypertension  Varicose veins of bilateral lower extremities with other complications  History of diabetes mellitus, type II: Patient was started on Ozempic by her PCP.  History of renal insufficiency  Orders: No orders of the defined types were placed in this encounter.  No orders of the defined types were placed in this encounter.     Follow-Up Instructions: Return in about 1 year (around 12/12/2022) for Rheumatoid arthritis, Osteoarthritis, DDD.   Ofilia Neas, PA-C  Note - This record has been created using Dragon software.  Chart creation errors have been sought, but may not always  have been located. Such creation errors do not reflect on  the standard of medical care.

## 2021-12-12 ENCOUNTER — Other Ambulatory Visit: Payer: Self-pay

## 2021-12-12 ENCOUNTER — Encounter: Payer: Self-pay | Admitting: Physician Assistant

## 2021-12-12 ENCOUNTER — Ambulatory Visit (INDEPENDENT_AMBULATORY_CARE_PROVIDER_SITE_OTHER): Payer: PPO | Admitting: Physician Assistant

## 2021-12-12 VITALS — BP 148/82 | HR 77 | Ht 67.5 in | Wt 223.0 lb

## 2021-12-12 DIAGNOSIS — M0579 Rheumatoid arthritis with rheumatoid factor of multiple sites without organ or systems involvement: Secondary | ICD-10-CM | POA: Diagnosis not present

## 2021-12-12 DIAGNOSIS — Z79899 Other long term (current) drug therapy: Secondary | ICD-10-CM | POA: Diagnosis not present

## 2021-12-12 DIAGNOSIS — I83893 Varicose veins of bilateral lower extremities with other complications: Secondary | ICD-10-CM

## 2021-12-12 DIAGNOSIS — I10 Essential (primary) hypertension: Secondary | ICD-10-CM

## 2021-12-12 DIAGNOSIS — M19041 Primary osteoarthritis, right hand: Secondary | ICD-10-CM | POA: Diagnosis not present

## 2021-12-12 DIAGNOSIS — M170 Bilateral primary osteoarthritis of knee: Secondary | ICD-10-CM | POA: Diagnosis not present

## 2021-12-12 DIAGNOSIS — Z87448 Personal history of other diseases of urinary system: Secondary | ICD-10-CM

## 2021-12-12 DIAGNOSIS — M19042 Primary osteoarthritis, left hand: Secondary | ICD-10-CM

## 2021-12-12 DIAGNOSIS — M5030 Other cervical disc degeneration, unspecified cervical region: Secondary | ICD-10-CM

## 2021-12-12 DIAGNOSIS — Z8639 Personal history of other endocrine, nutritional and metabolic disease: Secondary | ICD-10-CM

## 2021-12-12 DIAGNOSIS — R768 Other specified abnormal immunological findings in serum: Secondary | ICD-10-CM

## 2022-05-12 NOTE — Progress Notes (Deleted)
Office Visit Note  Patient: Jennifer Horn             Date of Birth: 11-10-1950           MRN: [id number]             PCP: Gordan Payment., MD Referring: Gordan Payment., MD Visit Date: 05/15/2022 Occupation: @GUAROCC @  Subjective:    History of Present Illness: Jennifer Horn is a 71 y.o. female with history of seropositive rheumatoid arthritis, DDD, and osteoarthritis.  She is not currently taking any immunosuppressive agents.   Activities of Daily Living:  Patient reports morning stiffness for *** {minute/hour:19697}.   Patient {ACTIONS;DENIES/REPORTS:21021675::"Denies"} nocturnal pain.  Difficulty dressing/grooming: {ACTIONS;DENIES/REPORTS:21021675::"Denies"} Difficulty climbing stairs: {ACTIONS;DENIES/REPORTS:21021675::"Denies"} Difficulty getting out of chair: {ACTIONS;DENIES/REPORTS:21021675::"Denies"} Difficulty using hands for taps, buttons, cutlery, and/or writing: {ACTIONS;DENIES/REPORTS:21021675::"Denies"}  No Rheumatology ROS completed.   PMFS History:  Patient Active Problem List   Diagnosis Date Noted   ANA positive 06/19/2017   DJD (degenerative joint disease), cervical 01/16/2017   Primary osteoarthritis of both knees 01/16/2017   Acute pain of right knee 09/26/2016   Diabetes (HCC) 08/21/2016   Hypertension 08/21/2016   Rheumatoid arthritis with rheumatoid factor of multiple sites without organ or systems involvement (HCC) 08/19/2016   High risk medication use 08/19/2016   Varicose veins of bilateral lower extremities with other complications 03/25/2014    Past Medical History:  Diagnosis Date   Anemia    Diabetes mellitus without complication (HCC)    Hypertension    Osteoarthritis    Rheumatoid arthritis (HCC)     Family History  Problem Relation Age of Onset   Diabetes Mother    Hyperlipidemia Mother    Hypertension Mother    Hypertension Father    Hypertension Daughter    Past Surgical History:  Procedure Laterality Date   cornea  ablation  10/2019   MOUTH SURGERY     Social History   Social History Narrative   Not on file   Immunization History  Administered Date(s) Administered   PFIZER(Purple Top)SARS-COV-2 Vaccination 08/05/2020   Pfizer Covid-19 Vaccine Bivalent Booster 58yrs & up 07/12/2021   Pneumococcal Conjugate-13 01/10/2017     Objective: Vital Signs: There were no vitals taken for this visit.   Physical Exam Vitals and nursing note reviewed.  Constitutional:      Appearance: She is well-developed.  HENT:     Head: Normocephalic and atraumatic.  Eyes:     Conjunctiva/sclera: Conjunctivae normal.  Cardiovascular:     Rate and Rhythm: Normal rate and regular rhythm.     Heart sounds: Normal heart sounds.  Pulmonary:     Effort: Pulmonary effort is normal.     Breath sounds: Normal breath sounds.  Abdominal:     General: Bowel sounds are normal.     Palpations: Abdomen is soft.  Musculoskeletal:     Cervical back: Normal range of motion.  Skin:    General: Skin is warm and dry.     Capillary Refill: Capillary refill takes less than 2 seconds.  Neurological:     Mental Status: She is alert and oriented to person, place, and time.  Psychiatric:        Behavior: Behavior normal.      Musculoskeletal Exam: ***  CDAI Exam: CDAI Score: -- Patient Global: --; Provider Global: -- Swollen: --; Tender: -- Joint Exam 05/15/2022   No joint exam has been documented for this visit   There is currently no information documented on  the homunculus. Go to the Rheumatology activity and complete the homunculus joint exam.  Investigation: No additional findings.  Imaging: No results found.  Recent Labs: Lab Results  Component Value Date   WBC 7.4 07/02/2017   HGB 11.9 07/02/2017   PLT 213 07/02/2017   NA 140 08/21/2017   K 4.2 08/21/2017   CL 102 08/21/2017   CO2 29 08/21/2017   GLUCOSE 161 (H) 08/21/2017   BUN 17 08/21/2017   CREATININE 1.18 (H) 08/21/2017   BILITOT 0.3  07/02/2017   ALKPHOS 97 04/23/2017   AST 13 07/02/2017   ALT 7 07/02/2017   PROT 7.0 07/02/2017   ALBUMIN 3.9 04/23/2017   CALCIUM 9.6 08/21/2017   GFRAA 56 (L) 08/21/2017   QFTBGOLD NEGATIVE 08/21/2017    Speciality Comments: Leflunomide-discontinued April 2019 due to rash Methotrexate discontinued September 2018-elevated creatinine  Procedures:  No procedures performed Allergies: Levofloxacin, Quinolones, and Leflunomide   Assessment / Plan:     Visit Diagnoses: No diagnosis found.  Orders: No orders of the defined types were placed in this encounter.  No orders of the defined types were placed in this encounter.   Face-to-face time spent with patient was *** minutes. Greater than 50% of time was spent in counseling and coordination of care.  Follow-Up Instructions: No follow-ups on file.   [name], CMA  Note - This record has been created using Animal nutritionist.  Chart creation errors have been sought, but may not always  have been located. Such creation errors do not reflect on  the standard of medical care.

## 2022-05-15 ENCOUNTER — Ambulatory Visit: Payer: BC Managed Care – PPO | Admitting: Physician Assistant

## 2022-05-15 DIAGNOSIS — Z87448 Personal history of other diseases of urinary system: Secondary | ICD-10-CM

## 2022-05-15 DIAGNOSIS — I10 Essential (primary) hypertension: Secondary | ICD-10-CM

## 2022-05-15 DIAGNOSIS — I83893 Varicose veins of bilateral lower extremities with other complications: Secondary | ICD-10-CM

## 2022-05-15 DIAGNOSIS — M5030 Other cervical disc degeneration, unspecified cervical region: Secondary | ICD-10-CM

## 2022-05-15 DIAGNOSIS — Z79899 Other long term (current) drug therapy: Secondary | ICD-10-CM

## 2022-05-15 DIAGNOSIS — M0579 Rheumatoid arthritis with rheumatoid factor of multiple sites without organ or systems involvement: Secondary | ICD-10-CM

## 2022-05-15 DIAGNOSIS — M170 Bilateral primary osteoarthritis of knee: Secondary | ICD-10-CM

## 2022-05-15 DIAGNOSIS — M19041 Primary osteoarthritis, right hand: Secondary | ICD-10-CM

## 2022-05-15 DIAGNOSIS — R768 Other specified abnormal immunological findings in serum: Secondary | ICD-10-CM

## 2022-05-15 DIAGNOSIS — Z8639 Personal history of other endocrine, nutritional and metabolic disease: Secondary | ICD-10-CM

## 2022-11-29 NOTE — Progress Notes (Signed)
Office Visit Note  Patient: Jennifer Horn             Date of Birth: 1950/12/20           MRN: [id number]             PCP: Raina Mina., MD Referring: Raina Mina., MD Visit Date: 12/12/2022 Occupation: '@GUAROCC'$ @  Subjective:  Medication maintenance   History of Present Illness: Jennifer Horn is a 72 y.o. female with history of seropositive rheumatoid arthritis, osteoarthritis, and DDD. Patient is feeling extremely well today. She denies any joint swelling or joint pain. She has lost 58 lbs over the past 16 months on Ozempic and states most of her joint pain has resolved with this weight loss. She stays busy working as a Oceanographer. She takes tylenol 650 mg PRN for pain which she states is very rare that she needs this. She is not on immunosuppressive therapy.   Activities of Daily Living:  Patient reports morning stiffness for 0  none .   Patient Denies nocturnal pain.  Difficulty dressing/grooming: Denies Difficulty climbing stairs: Denies Difficulty getting out of chair: Denies Difficulty using hands for taps, buttons, cutlery, and/or writing: Denies  Review of Systems  Constitutional:  Negative for fatigue.  HENT:  Negative for mouth sores and mouth dryness.   Eyes:  Negative for dryness.  Respiratory:  Negative for shortness of breath.   Cardiovascular:  Negative for chest pain and palpitations.  Gastrointestinal:  Negative for blood in stool, constipation and diarrhea.  Endocrine: Negative for increased urination.  Genitourinary:  Negative for involuntary urination.  Musculoskeletal:  Negative for joint pain, gait problem, joint pain, joint swelling, myalgias, muscle weakness, morning stiffness, muscle tenderness and myalgias.  Skin:  Negative for color change, rash, hair loss and sensitivity to sunlight.  Allergic/Immunologic: Negative for susceptible to infections.  Neurological:  Negative for dizziness and headaches.  Hematological:  Negative for  swollen glands.  Psychiatric/Behavioral:  Negative for depressed mood and sleep disturbance. The patient is not nervous/anxious.    PMFS History:  Patient Active Problem List   Diagnosis Date Noted   ANA positive 06/19/2017   DJD (degenerative joint disease), cervical 01/16/2017   Primary osteoarthritis of both knees 01/16/2017   Acute pain of right knee 09/26/2016   Diabetes ([place]) 08/21/2016   Hypertension 08/21/2016   Rheumatoid arthritis with rheumatoid factor of multiple sites without organ or systems involvement ([place]) 08/19/2016   High risk medication use 08/19/2016   Varicose veins of bilateral lower extremities with other complications [id number]    Past Medical History:  Diagnosis Date   Anemia    Diabetes mellitus without complication (HCC)    Hypertension    Osteoarthritis    Rheumatoid arthritis ([place])     Family History  Problem Relation Age of Onset   Diabetes Mother    Hyperlipidemia Mother    Hypertension Mother    Hypertension Father    Hypertension Daughter    Past Surgical History:  Procedure Laterality Date   cornea ablation  10/2019   MOUTH SURGERY     Social History   Social History Narrative   Not on file   Immunization History  Administered Date(s) Administered   PFIZER(Purple Top)SARS-COV-2 Vaccination 08/05/2020   Pfizer Covid-19 Vaccine Bivalent Booster 79yr & up 07/12/2021   Pneumococcal Conjugate-13 01/10/2017   Unspecified SARS-COV-2 Vaccination 07/21/2022     Objective: Vital Signs: BP (!) 178/81 (BP Location: Left Arm, Patient Position: Sitting, Cuff Size:  Normal)   Pulse 78   Resp 16   Ht 5' 7.5" (1.715 m)   Wt 191 lb (86.6 kg)   BMI 29.47 kg/m    Physical Exam Vitals and nursing note reviewed.  Constitutional:      Appearance: She is well-developed.  HENT:     Head: Normocephalic and atraumatic.  Eyes:     Conjunctiva/sclera: Conjunctivae normal.  Cardiovascular:     Rate and Rhythm: Normal rate and regular rhythm.      Heart sounds: Normal heart sounds.  Pulmonary:     Effort: Pulmonary effort is normal.     Breath sounds: Normal breath sounds.  Abdominal:     General: Bowel sounds are normal.     Palpations: Abdomen is soft.  Musculoskeletal:     Cervical back: Normal range of motion.  Lymphadenopathy:     Cervical: No cervical adenopathy.  Skin:    General: Skin is warm and dry.     Capillary Refill: Capillary refill takes less than 2 seconds.  Neurological:     Mental Status: She is alert and oriented to person, place, and time.  Psychiatric:        Behavior: Behavior normal.      Musculoskeletal Exam: Cervical, thoracic and lumbar spine were in good range of motion.  Kyphosis was noted.  Shoulder joints, elbow joints, wrist joints, MCPs PIPs and DIPs have good range of motion with no synovitis, warmth or effusion. DIP and PIP thickening bilaterally. Hip joints, knee joints, ankles, MTPs and PIPs with good range of motion with no synovitis, warmth or effusion.   CDAI Exam: CDAI Score: -- Patient Global: --; Provider Global: -- Swollen: --; Tender: -- Joint Exam 12/12/2022   No joint exam has been documented for this visit   There is currently no information documented on the homunculus. Go to the Rheumatology activity and complete the homunculus joint exam.  Investigation: No additional findings.  Imaging: No results found.  Recent Labs: Lab Results  Component Value Date   WBC 7.4 07/02/2017   HGB 11.9 07/02/2017   PLT 213 07/02/2017   NA 140 08/21/2017   K 4.2 08/21/2017   CL 102 08/21/2017   CO2 29 08/21/2017   GLUCOSE 161 (H) 08/21/2017   BUN 17 08/21/2017   CREATININE 1.18 (H) 08/21/2017   BILITOT 0.3 07/02/2017   ALKPHOS 97 04/23/2017   AST 13 07/02/2017   ALT 7 07/02/2017   PROT 7.0 07/02/2017   ALBUMIN 3.9 04/23/2017   CALCIUM 9.6 08/21/2017   GFRAA 56 (L) 08/21/2017   QFTBGOLD NEGATIVE 08/21/2017    Speciality Comments: Leflunomide-discontinued April  2019 due to rash Methotrexate discontinued September 2018-elevated creatinine  Procedures:  No procedures performed Allergies: Levofloxacin, Quinolones, and Leflunomide   Assessment / Plan:     Visit Diagnoses: Rheumatoid arthritis with rheumatoid factor of multiple sites without organ or systems involvement ([place]) - She has no joint tenderness or synovitis on examination today.  She has not had any signs or symptoms of a rheumatoid arthritis since her last office visit.  She has not currently taking any immunosuppressive agents.  Her rheumatoid arthritis remains in remission. She does not require immunosuppressive therapy at this time.  She was advised to notify us if she develops increased joint pain or joint swelling.  She will follow-up in the office in 1 year or sooner if needed.  High risk medication use - Was previously on Arava and methotrexate. She is not currently taking any  immunosuppressive agents.   Primary osteoarthritis of both hands - Thickening of DIP and PIP bilaterally. No warmth, effusion or synovitis.  Exam today was consistent with osteoarthritis.   Primary osteoarthritis of both knees -her pain has improved significantly with 58 lb weight loss. No warmth or effusion on physical exam.   DDD (degenerative disc disease), cervical - No discomfort at this time. Full ROM on exam.   ANA positive - No features of lupus.   Essential hypertension - Blood pressure was initially 178/81. Checked again at end of appointment and was 148/82. She will follow up with her PCP.   Other medical conditions are listed as follows:   History of renal insufficiency  History of diabetes mellitus, type II  Varicose veins of bilateral lower extremities with other complications - lymphedema of the left leg on physical exam. She follows up with vein clinic.   Orders: No orders of the defined types were placed in this encounter.  No orders of the defined types were placed in this  encounter.  Face-to-face time spent with patient was 30 minutes. Greater than 50% of time was spent in counseling and coordination of care.  Follow-Up Instructions: Return in about 1 year (around 12/13/2023) for Rheumatoid arthritis, Osteoarthritis.   Bo Merino, MD  Note - This record has been created using Editor, commissioning.  Chart creation errors have been sought, but may not always  have been located. Such creation errors do not reflect on  the standard of medical care.

## 2022-12-12 ENCOUNTER — Ambulatory Visit: Payer: BC Managed Care – PPO | Attending: Rheumatology | Admitting: Rheumatology

## 2022-12-12 ENCOUNTER — Encounter: Payer: Self-pay | Admitting: Rheumatology

## 2022-12-12 VITALS — BP 158/83 | HR 81 | Resp 16 | Ht 67.5 in | Wt 191.0 lb

## 2022-12-12 DIAGNOSIS — Z79899 Other long term (current) drug therapy: Secondary | ICD-10-CM

## 2022-12-12 DIAGNOSIS — Z87448 Personal history of other diseases of urinary system: Secondary | ICD-10-CM | POA: Diagnosis not present

## 2022-12-12 DIAGNOSIS — M0579 Rheumatoid arthritis with rheumatoid factor of multiple sites without organ or systems involvement: Secondary | ICD-10-CM

## 2022-12-12 DIAGNOSIS — M19041 Primary osteoarthritis, right hand: Secondary | ICD-10-CM | POA: Diagnosis not present

## 2022-12-12 DIAGNOSIS — I83893 Varicose veins of bilateral lower extremities with other complications: Secondary | ICD-10-CM

## 2022-12-12 DIAGNOSIS — Z8639 Personal history of other endocrine, nutritional and metabolic disease: Secondary | ICD-10-CM

## 2022-12-12 DIAGNOSIS — I10 Essential (primary) hypertension: Secondary | ICD-10-CM

## 2022-12-12 DIAGNOSIS — M5030 Other cervical disc degeneration, unspecified cervical region: Secondary | ICD-10-CM

## 2022-12-12 DIAGNOSIS — R768 Other specified abnormal immunological findings in serum: Secondary | ICD-10-CM

## 2022-12-12 DIAGNOSIS — M170 Bilateral primary osteoarthritis of knee: Secondary | ICD-10-CM

## 2022-12-12 DIAGNOSIS — M19042 Primary osteoarthritis, left hand: Secondary | ICD-10-CM

## 2023-11-27 NOTE — Progress Notes (Signed)
 Office Visit Note  Patient: Jennifer Horn             Date of Birth: September 09, 1951           MRN: [id number]             PCP: Gordan Payment., MD Referring: Gordan Payment., MD Visit Date: 12/11/2023 Occupation: @GUAROCC @  Subjective:  Joint stiffness  History of Present Illness: Jennifer Horn is a 73 y.o. female with seropositive rheumatoid arthritis, osteoarthritis and degenerative disc disease.  Patient states that she continues to have some stiffness but no joint swelling.  She has noticed improvement in the joint pain and discomfort since she had intentional weight loss.  She takes Tylenol only on as needed basis.  She has been off immunosuppressive therapy since 2019.  Patient states that she is still working and has been active.  She been also traveling.    Activities of Daily Living:  Patient reports morning stiffness for 0 minutes.   Patient Denies nocturnal pain.  Difficulty dressing/grooming: Denies Difficulty climbing stairs: Denies Difficulty getting out of chair: Denies Difficulty using hands for taps, buttons, cutlery, and/or writing: Denies  Review of Systems  Constitutional:  Negative for fatigue.  HENT:  Negative for mouth sores and mouth dryness.   Eyes:  Positive for dryness.  Respiratory:  Negative for shortness of breath.   Cardiovascular:  Negative for chest pain and palpitations.  Gastrointestinal:  Negative for blood in stool, constipation and diarrhea.  Endocrine: Negative for increased urination.  Genitourinary:  Negative for involuntary urination.  Musculoskeletal:  Negative for joint pain, gait problem, joint pain, joint swelling, myalgias, muscle weakness, morning stiffness, muscle tenderness and myalgias.  Skin:  Negative for color change, rash, hair loss and sensitivity to sunlight.  Allergic/Immunologic: Negative for susceptible to infections.  Neurological:  Negative for dizziness and headaches.  Hematological:  Negative for swollen  glands.  Psychiatric/Behavioral:  Negative for depressed mood and sleep disturbance. The patient is not nervous/anxious.     PMFS History:  Patient Active Problem List   Diagnosis Date Noted   Primary osteoarthritis of both hands 12/11/2023   ANA positive 06/19/2017   DJD (degenerative joint disease), cervical 01/16/2017   Primary osteoarthritis of both knees 01/16/2017   Acute pain of right knee 09/26/2016   Diabetes (HCC) 08/21/2016   Hypertension 08/21/2016   Rheumatoid arthritis with rheumatoid factor of multiple sites without organ or systems involvement (HCC) 08/19/2016   High risk medication use 08/19/2016   Varicose veins of bilateral lower extremities with other complications 03/25/2014    Past Medical History:  Diagnosis Date   Anemia    Diabetes mellitus without complication (HCC)    Hypertension    Osteoarthritis    Rheumatoid arthritis (HCC)     Family History  Problem Relation Age of Onset   Diabetes Mother    Hyperlipidemia Mother    Hypertension Mother    Hypertension Father    Hypertension Daughter    Past Surgical History:  Procedure Laterality Date   cornea ablation  10/2019   MOUTH SURGERY     Social History   Social History Narrative   Not on file   Immunization History  Administered Date(s) Administered   PFIZER(Purple Top)SARS-COV-2 Vaccination 11/01/2019, 11/22/2019, 08/05/2020   Pfizer Covid-19 Vaccine Bivalent Booster 60yrs & up 07/12/2021   Pfizer(Comirnaty)Fall Seasonal Vaccine 12 years and older 08/09/2023   Pneumococcal Conjugate-13 01/10/2017   Unspecified SARS-COV-2 Vaccination 07/21/2022     Objective:  Vital Signs: BP (!) 163/74 (BP Location: Left Arm, Patient Position: Sitting, Cuff Size: Large)   Pulse 86   Resp 14   Ht 5\' 7"  (1.702 m)   Wt 189 lb (85.7 kg)   BMI 29.60 kg/m    Physical Exam Vitals and nursing note reviewed.  Constitutional:      Appearance: She is well-developed.  HENT:     Head: Normocephalic and  atraumatic.  Eyes:     Conjunctiva/sclera: Conjunctivae normal.  Cardiovascular:     Rate and Rhythm: Normal rate and regular rhythm.     Heart sounds: Normal heart sounds.  Pulmonary:     Effort: Pulmonary effort is normal.     Breath sounds: Normal breath sounds.  Abdominal:     General: Bowel sounds are normal.     Palpations: Abdomen is soft.  Musculoskeletal:     Cervical back: Normal range of motion.  Lymphadenopathy:     Cervical: No cervical adenopathy.  Skin:    General: Skin is warm and dry.     Capillary Refill: Capillary refill takes less than 2 seconds.  Neurological:     Mental Status: She is alert and oriented to person, place, and time.  Psychiatric:        Behavior: Behavior normal.      Musculoskeletal Exam: Cervical spine was in good range of motion.  Thoracic kyphosis was noted.  Shoulder joints, elbow joints, wrist joints, MCPs PIPs and DIPs with good range of motion.  She had bilateral PIP and DIP thickening with no synovitis.  Hip joints and knee joints were in good range of motion.  There was no tenderness over ankles or MTPs.  CDAI Exam: CDAI Score: -- Patient Global: --; Provider Global: -- Swollen: --; Tender: -- Joint Exam 12/11/2023   No joint exam has been documented for this visit   There is currently no information documented on the homunculus. Go to the Rheumatology activity and complete the homunculus joint exam.  Investigation: No additional findings.  Imaging: No results found.  Recent Labs: Lab Results  Component Value Date   WBC 7.4 07/02/2017   HGB 11.9 07/02/2017   PLT 213 07/02/2017   NA 140 08/21/2017   K 4.2 08/21/2017   CL 102 08/21/2017   CO2 29 08/21/2017   GLUCOSE 161 (H) 08/21/2017   BUN 17 08/21/2017   CREATININE 1.18 (H) 08/21/2017   BILITOT 0.3 07/02/2017   ALKPHOS 97 04/23/2017   AST 13 07/02/2017   ALT 7 07/02/2017   PROT 7.0 07/02/2017   ALBUMIN 3.9 04/23/2017   CALCIUM 9.6 08/21/2017   GFRAA 56 (L)  08/21/2017   QFTBGOLD NEGATIVE 08/21/2017    Speciality Comments: Leflunomide-discontinued April 2019 due to rash Methotrexate discontinued September 2018-elevated creatinine  Procedures:  No procedures performed Allergies: Levofloxacin, Quinolones, and Leflunomide   Assessment / Plan:     Visit Diagnoses: Rheumatoid arthritis with rheumatoid factor of multiple sites without organ or systems involvement (HCC)-patient had no synovitis on the examination today.  She has been off methotrexate since 2019.  She has intermittent stiffness in her joints.  She states the pain is manageable with Tylenol.  She was treated with leflunomide and methotrexate in the past.  Advised patient to contact me if she develops any new symptoms otherwise she will come back in a year.  Primary osteoarthritis of both hands-bilateral PIP and DIP thickening with no synovitis was noted.  Joint protection muscle strengthening was discussed.  A handout on  hand exercises was given.  Primary osteoarthritis of both knees-she has intermittent discomfort in her knee joints.  No warmth swelling or effusion was noted.  DDD (degenerative disc disease), cervical-she had good range of motion of the cervical spine.  Postural kyphosis of thoracic region-exercises were demonstrated and a handout was given.  ANA positive - No features of lupus.  She has mild eye dryness.  There is no history of oral ulcers, nasal ulcers, malar rash, photosensitivity, Raynaud's, lymphadenopathy or inflammatory arthritis.  Essential hypertension-blood pressure was elevated at 163/74.  She was advised to monitor pressure closely and follow-up with her PCP.  Other medical problems are listed as follows:  History of renal insufficiency  History of diabetes mellitus, type II  Varicose veins of bilateral lower extremities with other complications  Osteoporosis screening-there is no DEXA scan on file.  Patient states she will get DEXA scan with her  PCP.  Orders: No orders of the defined types were placed in this encounter.  No orders of the defined types were placed in this encounter.    Follow-Up Instructions: Return in about 1 year (around 12/10/2024) for Rheumatoid arthritis, Osteoarthritis.   Pollyann Savoy, MD  Note - This record has been created using [employer].  Chart creation errors have been sought, but may not always  have been located. Such creation errors do not reflect on  the standard of medical care.

## 2023-12-11 ENCOUNTER — Ambulatory Visit: Payer: PPO | Attending: Rheumatology | Admitting: Rheumatology

## 2023-12-11 ENCOUNTER — Encounter: Payer: Self-pay | Admitting: Rheumatology

## 2023-12-11 VITALS — BP 163/74 | HR 86 | Resp 14 | Ht 67.0 in | Wt 189.0 lb

## 2023-12-11 DIAGNOSIS — Z13820 Encounter for screening for osteoporosis: Secondary | ICD-10-CM

## 2023-12-11 DIAGNOSIS — M19041 Primary osteoarthritis, right hand: Secondary | ICD-10-CM | POA: Diagnosis not present

## 2023-12-11 DIAGNOSIS — M170 Bilateral primary osteoarthritis of knee: Secondary | ICD-10-CM | POA: Diagnosis not present

## 2023-12-11 DIAGNOSIS — Z79899 Other long term (current) drug therapy: Secondary | ICD-10-CM

## 2023-12-11 DIAGNOSIS — Z8639 Personal history of other endocrine, nutritional and metabolic disease: Secondary | ICD-10-CM

## 2023-12-11 DIAGNOSIS — M19042 Primary osteoarthritis, left hand: Secondary | ICD-10-CM | POA: Insufficient documentation

## 2023-12-11 DIAGNOSIS — I83893 Varicose veins of bilateral lower extremities with other complications: Secondary | ICD-10-CM

## 2023-12-11 DIAGNOSIS — M0579 Rheumatoid arthritis with rheumatoid factor of multiple sites without organ or systems involvement: Secondary | ICD-10-CM | POA: Diagnosis not present

## 2023-12-11 DIAGNOSIS — I10 Essential (primary) hypertension: Secondary | ICD-10-CM

## 2023-12-11 DIAGNOSIS — R768 Other specified abnormal immunological findings in serum: Secondary | ICD-10-CM

## 2023-12-11 DIAGNOSIS — Z87448 Personal history of other diseases of urinary system: Secondary | ICD-10-CM

## 2023-12-11 DIAGNOSIS — M4004 Postural kyphosis, thoracic region: Secondary | ICD-10-CM

## 2023-12-11 DIAGNOSIS — M5030 Other cervical disc degeneration, unspecified cervical region: Secondary | ICD-10-CM | POA: Diagnosis not present

## 2023-12-11 NOTE — Patient Instructions (Signed)
 Hand Exercises Hand exercises can be helpful for almost anyone. They can strengthen your hands and improve flexibility and movement. The exercises can also increase blood flow to the hands. These results can make your work and daily tasks easier for you. Hand exercises can be especially helpful for people who have joint pain from arthritis or nerve damage from using their hands over and over. These exercises can also help people who injure a hand. Exercises Most of these hand exercises are gentle stretching and motion exercises. It is usually safe to do them often throughout the day. Warming up your hands before exercise may help reduce stiffness. You can do this with gentle massage or by placing your hands in warm water for 10-15 minutes. It is normal to feel some stretching, pulling, tightness, or mild discomfort when you begin new exercises. In time, this will improve. Remember to always be careful and stop right away if you feel sudden, very bad pain or your pain gets worse. You want to get better and be safe. Ask your health care provider which exercises are safe for you. Do exercises exactly as told by your provider and adjust them as told. Do not begin these exercises until told by your provider. Knuckle bend or "claw" fist  Stand or sit with your arm, hand, and all five fingers pointed straight up. Make sure to keep your wrist straight. Gently bend your fingers down toward your palm until the tips of your fingers are touching your palm. Keep your big knuckle straight and only bend the small knuckles in your fingers. Hold this position for 10 seconds. Straighten your fingers back to your starting position. Repeat this exercise 5-10 times with each hand. Full finger fist  Stand or sit with your arm, hand, and all five fingers pointed straight up. Make sure to keep your wrist straight. Gently bend your fingers into your palm until the tips of your fingers are touching the middle of your  palm. Hold this position for 10 seconds. Extend your fingers back to your starting position, stretching every joint fully. Repeat this exercise 5-10 times with each hand. Straight fist  Stand or sit with your arm, hand, and all five fingers pointed straight up. Make sure to keep your wrist straight. Gently bend your fingers at the big knuckle, where your fingers meet your hand, and at the middle knuckle. Keep the knuckle at the tips of your fingers straight and try to touch the bottom of your palm. Hold this position for 10 seconds. Extend your fingers back to your starting position, stretching every joint fully. Repeat this exercise 5-10 times with each hand. Tabletop  Stand or sit with your arm, hand, and all five fingers pointed straight up. Make sure to keep your wrist straight. Gently bend your fingers at the big knuckle, where your fingers meet your hand, as far down as you can. Keep the small knuckles in your fingers straight. Think of forming a tabletop with your fingers. Hold this position for 10 seconds. Extend your fingers back to your starting position, stretching every joint fully. Repeat this exercise 5-10 times with each hand. Finger spread  Place your hand flat on a table with your palm facing down. Make sure your wrist stays straight. Spread your fingers and thumb apart from each other as far as you can until you feel a gentle stretch. Hold this position for 10 seconds. Bring your fingers and thumb tight together again. Hold this position for 10 seconds. Repeat  this exercise 5-10 times with each hand. Making circles  Stand or sit with your arm, hand, and all five fingers pointed straight up. Make sure to keep your wrist straight. Make a circle by touching the tip of your thumb to the tip of your index finger. Hold for 10 seconds. Then open your hand wide. Repeat this motion with your thumb and each of your fingers. Repeat this exercise 5-10 times with each hand. Thumb  motion  Sit with your forearm resting on a table and your wrist straight. Your thumb should be facing up toward the ceiling. Keep your fingers relaxed as you move your thumb. Lift your thumb up as high as you can toward the ceiling. Hold for 10 seconds. Bend your thumb across your palm as far as you can, reaching the tip of your thumb for the small finger (pinkie) side of your palm. Hold for 10 seconds. Repeat this exercise 5-10 times with each hand. Grip strengthening  Hold a stress ball or other soft ball in the middle of your hand. Slowly increase the pressure, squeezing the ball as much as you can without causing pain. Think of bringing the tips of your fingers into the middle of your palm. All of your finger joints should bend when doing this exercise. Hold your squeeze for 10 seconds, then relax. Repeat this exercise 5-10 times with each hand. Contact a health care provider if: Your hand pain or discomfort gets much worse when you do an exercise. Your hand pain or discomfort does not improve within 2 hours after you exercise. If you have either of these problems, stop doing these exercises right away. Do not do them again unless your provider says that you can. Get help right away if: You develop sudden, severe hand pain or swelling. If this happens, stop doing these exercises right away. Do not do them again unless your provider says that you can. This information is not intended to replace advice given to you by your health care provider. Make sure you discuss any questions you have with your health care provider. Document Revised: 10/17/2022 Document Reviewed: 10/17/2022 Elsevier Patient Education  2024 [employer]Thoracic Strain Rehab Ask your health care provider which exercises are safe for you. Do exercises exactly as told by your provider and adjust them as directed. It is normal to feel mild stretching, pulling, tightness, or discomfort as you do these exercises. Stop right  away if you feel sudden pain or your pain gets worse. Do not begin these exercises until told by your provider. Stretching and range-of-motion exercise This exercise warms up your muscles and joints and improves the movement and flexibility of your back and shoulders. This exercise also helps to relieve pain. Chest and spine stretch  Lie down on your back on a firm surface. Roll a towel or a small blanket so it is about 4 inches (10 cm) in diameter. Put the towel under the middle of your back so it is under your spine, but not under your shoulder blades. Put your hands behind your head and let your elbows fall to your sides. This will increase your stretch. Take a deep breath (inhale). Hold for __________ seconds. Relax after you breathe out (exhale). Repeat __________ times. Complete this exercise __________ times a day. Strengthening exercises These exercises build strength and endurance in your back and your shoulder blade muscles. Endurance is the ability to use your muscles for a long time, even after they get tired. Alternating arm and leg  raises  Get on your hands and knees on a firm surface. If you are on a hard floor, you may want to use padding, such as an exercise mat, to cushion your knees. Line up your arms and legs. Your hands should be directly below your shoulders, and your knees should be directly below your hips. Lift your left leg behind you. At the same time, raise your right arm and straighten it in front of you. Do not lift your leg higher than your hip. Do not lift your arm higher than your shoulder. Keep your abdominal and back muscles tight. Keep your hips facing the ground. Do not arch your back. Carefully stay balanced. Do not hold your breath. Hold for __________ seconds. Slowly return to the starting position and repeat with your right leg and your left arm. Repeat __________ times. Complete this exercise __________ times a day. Straight arm rows This  exercise is also called the shoulder extension exercise. Stand with your feet shoulder width apart. Secure an exercise band to a stable object in front of you so the band is at or above shoulder height. Hold one end of the exercise band in each hand. Straighten your elbows and lift your hands up to shoulder height. Step back, away from the secured end of the exercise band, until the band stretches. Squeeze your shoulder blades together and pull your hands down to the sides of your thighs. Stop when your hands are straight down by your sides. This is shoulder extension. Do not let your hands go behind your body. Hold for __________ seconds. Slowly return to the starting position. Repeat __________ times. Complete this exercise __________ times a day. Rowing scapular retraction This is an exercise in which the shoulder blades (scapulae) are pulled toward each other (retraction). Sit in a stable chair without armrests, or stand up. Secure an exercise band to a stable object in front of you so the band is at shoulder height. Hold one end of the exercise band in each hand. Your palms should face toward each other. Bring your arms out straight in front of you. Step back, away from the secured end of the exercise band, until the band stretches. Pull the band backward. As you do this, bend your elbows and squeeze your shoulder blades together, but avoid letting the rest of your body move. Do not shrug your shoulders upward while you do this. Stop when your elbows are at your sides or slightly behind your body. Hold for __________ seconds. Slowly straighten your arms to return to the starting position. Repeat __________ times. Complete this exercise __________ times a day. Posture and body mechanics Good posture and healthy body mechanics can help to relieve stress in your body's tissues and joints. Body mechanics refers to the movements and positions of your body while you do your daily activities.  Posture is part of body mechanics. Good posture means: Your spine is in its natural S-curve position (neutral). Your shoulders are pulled back slightly. Your head is not tipped forward. Follow these guidelines to improve your posture and body mechanics in your everyday activities. Standing  When standing, keep your spine neutral and your feet about hip width apart. Keep a slight bend in your knees. Your ears, shoulders, and hips should line up with each other. When you do a task in which you lean forward while standing in one place for a long time, place one foot up on a stable object that is 2-4 inches (5-10 cm) high,  such as a footstool. This helps keep your spine neutral. Sitting  When sitting, keep your spine neutral and keep your feet flat on the floor. Use a footrest if needed. Keep your thighs parallel to the floor. Avoid rounding your shoulders, and avoid tilting your head forward. When working at a desk or a computer, keep your desk at a height where your hands are slightly lower than your elbows. Slide your chair under your desk so you are close enough to maintain good posture. When working at a computer, place your monitor at a height where you are looking straight ahead and you do not have to tilt your head forward or downward to look at the screen. Resting When lying down and resting, avoid positions that are most painful for you. If you have pain with activities such as sitting, bending, stooping, or squatting (flexion-basedactivities), lie in a position in which your body does not bend very much. For example, avoid curling up on your side with your arms and knees near your chest (fetal position). If you have pain with activities such as standing for a long time or reaching with your arms (extension-basedactivities), lie with your spine in a neutral position and bend your knees slightly. Try the following positions: Lie on your side with a pillow between your knees. Lie on your back  with a pillow under your knees.  Lifting  When lifting objects, keep your feet at least shoulder width apart and tighten your abdominal muscles. Bend your knees and hips and keep your spine neutral. It is important to lift using the strength of your legs, not your back. Do not lock your knees straight out. Always ask for help to lift heavy or awkward objects. This information is not intended to replace advice given to you by your health care provider. Make sure you discuss any questions you have with your health care provider. Document Revised: 03/16/2023 Document Reviewed: 05/22/2022 Elsevier Patient Education  2024 [employer].

## 2024-12-09 ENCOUNTER — Ambulatory Visit: Payer: PPO | Admitting: Rheumatology
